# Patient Record
Sex: Female | Born: 1955 | Race: White | Hispanic: No | Marital: Married | Smoking: Never smoker
Health system: Southern US, Community
[De-identification: ages and names within clinical notes are randomized; demographics above are authoritative.]

## PROBLEM LIST (undated history)

## (undated) DIAGNOSIS — H6690 Otitis media, unspecified, unspecified ear: Secondary | ICD-10-CM

## (undated) DIAGNOSIS — I10 Essential (primary) hypertension: Secondary | ICD-10-CM

## (undated) DIAGNOSIS — N200 Calculus of kidney: Secondary | ICD-10-CM

## (undated) DIAGNOSIS — F419 Anxiety disorder, unspecified: Secondary | ICD-10-CM

## (undated) DIAGNOSIS — E669 Obesity, unspecified: Secondary | ICD-10-CM

## (undated) DIAGNOSIS — Z803 Family history of malignant neoplasm of breast: Secondary | ICD-10-CM

## (undated) DIAGNOSIS — M1990 Unspecified osteoarthritis, unspecified site: Secondary | ICD-10-CM

## (undated) DIAGNOSIS — E785 Hyperlipidemia, unspecified: Secondary | ICD-10-CM

## (undated) DIAGNOSIS — L719 Rosacea, unspecified: Secondary | ICD-10-CM

## (undated) DIAGNOSIS — G4733 Obstructive sleep apnea (adult) (pediatric): Secondary | ICD-10-CM

## (undated) DIAGNOSIS — J209 Acute bronchitis, unspecified: Secondary | ICD-10-CM

## (undated) HISTORY — PX: APPENDECTOMY: SHX54

## (undated) HISTORY — DX: Otitis media, unspecified, unspecified ear: H66.90

## (undated) HISTORY — DX: Obstructive sleep apnea (adult) (pediatric): G47.33

## (undated) HISTORY — DX: Acute bronchitis, unspecified: J20.9

## (undated) HISTORY — DX: Obesity, unspecified: E66.9

## (undated) HISTORY — DX: Hyperlipidemia, unspecified: E78.5

## (undated) HISTORY — PX: ABDOMINAL HYSTERECTOMY: SHX81

## (undated) HISTORY — DX: Essential (primary) hypertension: I10

## (undated) HISTORY — DX: Family history of malignant neoplasm of breast: Z80.3

## (undated) HISTORY — PX: TUBAL LIGATION: SHX77

## (undated) HISTORY — DX: Calculus of kidney: N20.0

## (undated) HISTORY — DX: Rosacea, unspecified: L71.9

## (undated) HISTORY — DX: Anxiety disorder, unspecified: F41.9

---

## 1984-08-15 HISTORY — PX: VESICOVAGINAL FISTULA CLOSURE W/ TAH: SUR271

## 2001-12-16 ENCOUNTER — Emergency Department (HOSPITAL_COMMUNITY): Admission: EM | Admit: 2001-12-16 | Discharge: 2001-12-16 | Payer: Self-pay | Admitting: Emergency Medicine

## 2001-12-16 ENCOUNTER — Encounter: Payer: Self-pay | Admitting: Emergency Medicine

## 2003-08-16 HISTORY — PX: CHOLECYSTECTOMY: SHX55

## 2005-07-17 ENCOUNTER — Emergency Department (HOSPITAL_COMMUNITY): Admission: EM | Admit: 2005-07-17 | Discharge: 2005-07-17 | Payer: Self-pay | Admitting: Emergency Medicine

## 2011-08-17 ENCOUNTER — Encounter: Payer: Self-pay | Admitting: Internal Medicine

## 2011-08-18 ENCOUNTER — Encounter: Payer: Self-pay | Admitting: Internal Medicine

## 2011-08-18 ENCOUNTER — Ambulatory Visit (INDEPENDENT_AMBULATORY_CARE_PROVIDER_SITE_OTHER): Payer: Managed Care, Other (non HMO) | Admitting: Internal Medicine

## 2011-08-18 ENCOUNTER — Ambulatory Visit (INDEPENDENT_AMBULATORY_CARE_PROVIDER_SITE_OTHER)
Admission: RE | Admit: 2011-08-18 | Discharge: 2011-08-18 | Disposition: A | Discharge status: Home / Self Care | Payer: Managed Care, Other (non HMO) | Source: Ambulatory Visit | Attending: Internal Medicine | Admitting: Internal Medicine

## 2011-08-18 VITALS — BP 128/80 | HR 133 | Temp 98.5°F | Ht 61.0 in | Wt 214.8 lb

## 2011-08-18 DIAGNOSIS — R059 Cough, unspecified: Secondary | ICD-10-CM | POA: Insufficient documentation

## 2011-08-18 DIAGNOSIS — R05 Cough: Secondary | ICD-10-CM

## 2011-08-18 MED ORDER — PREDNISONE (PAK) 10 MG PO TABS: ORAL_TABLET | ORAL | Status: AC

## 2011-08-18 MED ORDER — ESOMEPRAZOLE MAGNESIUM 40 MG PO CPDR: DELAYED_RELEASE_CAPSULE | ORAL | Status: DC

## 2011-08-18 MED ORDER — TRAMADOL HCL 50 MG PO TABS: ORAL_TABLET | ORAL | Status: AC

## 2011-08-18 NOTE — Assessment & Plan Note (Signed)
The most common causes of chronic cough in immunocompetent adults include the following: upper airway cough syndrome (UACS), previously referred to as postnasal drip syndrome (PNDS), which is caused by variety of rhinosinus conditions; (2) asthma; (3) GERD; (4) chronic bronchitis from cigarette smoking or other inhaled environmental irritants; (5) nonasthmatic eosinophilic bronchitis; and (6) bronchiectasis.   These conditions, singly or in combination, have accounted for up to 94% of the causes of chronic cough in prospective studies.   Other conditions have constituted no >6% of the causes in prospective studies These have included bronchogenic carcinoma, chronic interstitial pneumonia, sarcoidosis, left ventricular failure, ACEI-induced cough, and aspiration from a condition associated with pharyngeal dysfunction.   Of the three most common causes of chronic cough, only one (GERD)  can actually cause the other two (asthma and post nasal drip syndrome)  and perpetuate the cylce of cough inducing airway trauma, inflammation, heightened sensitivity to reflux which is prompted by the cough itself via a cyclical mechanism which she seems to get stuck in.   This may partially respond to steroids and look like asthma and post nasal drainage but never erradicated completely unless the cough and the secondary reflux are eliminated, preferably both at the same time.  While not intuitively obvious, many patients with chronic low grade reflux do not cough until there is a secondary insult that disturbs the protective epithelial barrier and exposes sensitive nerve endings.  This can be viral or direct physical injury such as with an endotracheal tube.   The point is that once this occurs, it is difficult to eliminate using anything but a maximally effective acid suppression regimen at least in the short run, accompanied by an appropriate diet to address non acid GERD.   See instructions for specific  recommendations which were reviewed directly with the patient who was given a copy with highlighter outlining the key components.

## 2011-08-18 NOTE — Patient Instructions (Addendum)
The key to effective treatment for your cough is eliminating the non-stop cycle of cough you're stuck in long enough to let your airway heal completely and then see if there is anything still making you cough once you stop the cough suppression, but this should take no more than 5 days to figuure out.  Stop pulmicort and all inhalants and lozenges  First take delsym two tsp every 12 hours and supplement if needed with  tramadol 50 mg up to 2 every 4 hours to suppress the urge to cough. Swallowing water or using ice chips/non mint and menthol containing candies (such as lifesavers or sugarless jolly ranchers) are also effective.  You should rest your voice and avoid activities that you know make you cough.  Once you have eliminated the cough for 3 straight days try reducing the tramadol first,  then the delsym as tolerated.    Try Nexium 40 mg    Take 30-60 min before first meal of the day and Pepcid 20 mg one bedtime until cough is completely gone for at least a week without the need for cough suppression  I think of reflux for chronic cough like I do oxygen for fire (doesn't cause the fire but once you get the oxygen suppressed it usually goes away regardless of the exact cause).  GERD (REFLUX)  is an extremely common cause of respiratory symptoms, many times with no significant heartburn at all.    It can be treated with medication, but also with lifestyle changes including avoidance of late meals, excessive alcohol, smoking cessation, and avoid fatty foods, chocolate, peppermint, colas, red wine, and acidic juices such as orange juice.  NO MINT OR MENTHOL PRODUCTS SO NO COUGH DROPS  USE SUGARLESS CANDY INSTEAD (jolley ranchers or Stover's)  NO OIL BASED VITAMINS - use powdered substitutes.    Prednisone 10 mg take  4 each am x 2 days,   2 each am x 2 days,  1 each am x2days and stop    Please remember to go to the  x-ray department downstairs for your tests - we will call you with the  results when they are available.      If you are satisfied with your treatment plan let your doctor know and he/she can either refill your medications or you can return here when your prescription runs out.     If in any way you are not 100% satisfied,  please return with all medications .  If 100% better, tell your friends!

## 2011-08-18 NOTE — Progress Notes (Signed)
  Subjective:    Patient ID: Madison Wheeler, female    DOB: Jul 19, 1956, 56 y.o.   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   08/18/2011 1st pulmonary eval already by Kozlow "no allergies" dx ? Pertussis  sev years ago completely resolved after a month tends to come back once a year not necessarily in winter and  eval by Chodri dx by sleep apnea  started on cpap x one year and this present exac abruptly   started 2 months ago with cough mostly dry assoc fever  Which resolved p zpak but the cough never improved - severe enough to where  gags and  vomits / urinary incont / worse with deodorizer/ change in temps/ very hoarse / does sleep after getting codeine s premature awakening.   Already on nexium but not taking ac consistently. No overt HB or sinus complaints   Also denies any obvious fluctuation of symptoms with weather or environmental changes or other aggravating or alleviating factors except as outlined above    Review of Systems  Constitutional: Negative for fever, chills and unexpected weight change.  HENT: Negative for ear pain, nosebleeds, congestion, sore throat, rhinorrhea, sneezing, trouble swallowing, dental problem, voice change, postnasal drip and sinus pressure.   Eyes: Negative for visual disturbance.  Respiratory: Positive for cough. Negative for choking and shortness of breath.   Cardiovascular: Negative for chest pain and leg swelling.  Gastrointestinal: Negative for vomiting, abdominal pain and diarrhea.  Genitourinary: Negative for difficulty urinating.  Musculoskeletal: Negative for arthralgias.  Skin: Negative for rash.  Neurological: Positive for headaches. Negative for tremors and syncope.  Hematological: Does not bruise/bleed easily.       Objective:   Physical Exam  Obese wf nad with freq throat clearing and harsh barking quality cough  Wt 214 08/18/2011   HEENT: nl  dentition, turbinates, and orophanx. Nl external ear canals without cough reflex   NECK :  without JVD/Nodes/TM/ nl carotid upstrokes bilaterally   LUNGS: no acc muscle use, clear to A and P bilaterally without cough on insp or exp maneuvers   CV:  RRR  no s3 or murmur or increase in P2, no edema   ABD:  soft and nontender with nl excursion in the supine position. No bruits or organomegaly, bowel sounds nl  MS:  warm without deformities, calf tenderness, cyanosis or clubbing  SKIN: warm and dry without lesions    NEURO:  alert, approp, no deficits    CXR  08/18/2011 : Low lung volumes. Suspect mild bibasilar scarring.           Assessment & Plan:

## 2011-08-19 ENCOUNTER — Telehealth: Payer: Self-pay | Admitting: Internal Medicine

## 2011-08-19 NOTE — Telephone Encounter (Signed)
I spoke with patient about results and she verbalized understanding and had no questions 

## 2015-06-12 ENCOUNTER — Other Ambulatory Visit: Payer: Self-pay | Admitting: Obstetrics and Gynecology

## 2015-06-12 DIAGNOSIS — N6320 Unspecified lump in the left breast, unspecified quadrant: Secondary | ICD-10-CM

## 2015-06-17 ENCOUNTER — Ambulatory Visit
Admission: RE | Admit: 2015-06-17 | Discharge: 2015-06-17 | Disposition: A | Discharge status: Home / Self Care | Payer: Commercial Indemnity | Source: Ambulatory Visit | Attending: Obstetrics and Gynecology | Admitting: Obstetrics and Gynecology

## 2015-06-17 ENCOUNTER — Other Ambulatory Visit: Payer: Self-pay | Admitting: Obstetrics and Gynecology

## 2015-06-17 DIAGNOSIS — N6320 Unspecified lump in the left breast, unspecified quadrant: Secondary | ICD-10-CM

## 2015-06-18 ENCOUNTER — Other Ambulatory Visit: Payer: Self-pay | Admitting: Obstetrics and Gynecology

## 2015-06-18 DIAGNOSIS — N6320 Unspecified lump in the left breast, unspecified quadrant: Secondary | ICD-10-CM

## 2015-06-19 ENCOUNTER — Ambulatory Visit
Admission: RE | Admit: 2015-06-19 | Discharge: 2015-06-19 | Disposition: A | Discharge status: Home / Self Care | Payer: Commercial Indemnity | Source: Ambulatory Visit | Attending: Obstetrics and Gynecology | Admitting: Obstetrics and Gynecology

## 2015-06-19 DIAGNOSIS — N6320 Unspecified lump in the left breast, unspecified quadrant: Secondary | ICD-10-CM

## 2020-09-15 DIAGNOSIS — C801 Malignant (primary) neoplasm, unspecified: Secondary | ICD-10-CM

## 2020-09-15 HISTORY — DX: Malignant (primary) neoplasm, unspecified: C80.1

## 2020-09-28 ENCOUNTER — Other Ambulatory Visit: Payer: Self-pay | Admitting: Specialist

## 2020-09-28 DIAGNOSIS — R928 Other abnormal and inconclusive findings on diagnostic imaging of breast: Secondary | ICD-10-CM

## 2020-09-29 ENCOUNTER — Other Ambulatory Visit: Payer: Self-pay | Admitting: Specialist

## 2020-09-29 DIAGNOSIS — R928 Other abnormal and inconclusive findings on diagnostic imaging of breast: Secondary | ICD-10-CM

## 2020-10-09 ENCOUNTER — Ambulatory Visit
Admission: RE | Admit: 2020-10-09 | Discharge: 2020-10-09 | Disposition: A | Discharge status: Home / Self Care | Payer: Medicare Other | Source: Ambulatory Visit | Attending: Specialist | Admitting: Specialist

## 2020-10-09 ENCOUNTER — Other Ambulatory Visit: Payer: Self-pay

## 2020-10-09 DIAGNOSIS — R928 Other abnormal and inconclusive findings on diagnostic imaging of breast: Secondary | ICD-10-CM

## 2020-10-13 ENCOUNTER — Telehealth: Payer: Self-pay | Admitting: Hematology and Oncology

## 2020-10-13 ENCOUNTER — Encounter: Payer: Self-pay | Admitting: *Deleted

## 2020-10-13 DIAGNOSIS — D0511 Intraductal carcinoma in situ of right breast: Secondary | ICD-10-CM | POA: Insufficient documentation

## 2020-10-13 NOTE — Telephone Encounter (Signed)
Spoke to patient to confirm afternoon appointment packet mailed, patient was also informed she could bring her husband and her sister to this appointment

## 2020-10-20 NOTE — Progress Notes (Signed)
[facility] CONSULT NOTE  Patient Care Team: Raina Mina., MD as PCP - General (Internal Medicine) Mauro Kaufmann, RN as Oncology Nurse Navigator Rockwell Germany, RN as Oncology Nurse Navigator  CHIEF COMPLAINTS/PURPOSE OF CONSULTATION:  Newly diagnosed breast cancer  HISTORY OF PRESENTING ILLNESS:  Madison Wheeler 65 y.o. female is here because of recent diagnosis of DCIS of the right breast. Screening mammogram on 09/07/20 showed right breast calcifications. Diagnostic mammogram on 09/24/20 showed right breast calcifications spanning 0.9cm and enlarged left axillary lymph nodes. Biopsy on 10/09/20 showed high grade ductal carcinoma in situ, ER+ 95%, PR+ 90%. She presents to the clinic today for initial evaluation and discussion of treatment options.   I reviewed her records extensively and collaborated the history with the patient.  SUMMARY OF ONCOLOGIC HISTORY: Oncology History  Ductal carcinoma in situ (DCIS) of right breast  10/13/2020 Initial Diagnosis   Screening mammogram showed right breast calcifications. Diagnostic mammogram showed right breast calcifications spanning 0.9cm and enlarged left axillary lymph nodes. 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   10/21/2020 Cancer Staging   Staging form: Breast, AJCC 8th Edition - Clinical stage from 10/21/2020: Stage 0 (cTis (DCIS), cN0, cM0, G3, ER+, PR+, HER2: Not Assessed) - Signed by Nicholas Lose, MD on 10/21/2020 Stage prefix: Initial diagnosis Laterality: Right Staged by: Pathologist and managing physician Stage used in treatment planning: Yes National guidelines used in treatment planning: Yes Type of national guideline used in treatment planning: NCCN     MEDICAL HISTORY:  Past Medical History:  Diagnosis Date  . Acute bronchitis   . Anxiety   . Calculus of kidney   . Family history of malignant neoplasm of breast   . Hyperlipidemia   . Hypertension   . Obesity   . OSA  (obstructive sleep apnea)   . Rosacea   . Unspecified otitis media   . Vitamin D deficiency     SURGICAL HISTORY:  The histories are not reviewed yet. Please review them in the "History" navigator section and refresh this [place].  SOCIAL HISTORY: Social History   Socioeconomic History  . Marital status: Married    Spouse name: Not on file  . Number of children: 2  . Years of education: Not on file  . Highest education level: Not on file  Occupational History  . Occupation: Therapist, sports  Tobacco Use  . Smoking status: Never Smoker  . Smokeless tobacco: Never Used  Substance and Sexual Activity  . Alcohol use: No  . Drug use: No  . Sexual activity: Not on file  Other Topics Concern  . Not on file  Social History Narrative  . Not on file   Social Determinants of Health   Financial Resource Strain: Not on file  Food Insecurity: Not on file  Transportation Needs: Not on file  Physical Activity: Not on file  Stress: Not on file  Social Connections: Not on file  Intimate Partner Violence: Not on file    FAMILY HISTORY: Family History  Problem Relation Age of Onset  . Breast cancer Maternal Grandmother   . Breast cancer Sister 54  . Breast cancer Maternal Aunt 47  . Breast cancer Paternal Grandmother 46  . Breast cancer Paternal Aunt 66  . Colon cancer Paternal Uncle   . Breast cancer Sister 69       twin sister  . Lung cancer Paternal Uncle   . Breast cancer Cousin 55  . Breast cancer  Cousin 27  . Breast cancer Cousin 11  . Breast cancer Maternal Aunt 60  . Breast cancer Maternal Aunt 58    ALLERGIES:  has No Known Allergies.  MEDICATIONS:  Current Outpatient Medications  Medication Sig Dispense Refill  . Ascorbic Acid (VITAMIN C PO) Take 1,000 mg by mouth daily.    Marland Kitchen atorvastatin (LIPITOR) 10 MG tablet Take 10 mg by mouth daily.    . cloNIDine (CATAPRES) 0.2 MG tablet Take 0.2 mg by mouth 2 (two) times daily.    Marland Kitchen esomeprazole (NEXIUM) 40 MG capsule Take 30-60  min before first meal of the day 30 capsule 1   No current facility-administered medications for this visit.    REVIEW OF SYSTEMS:   Constitutional: Denies fevers, chills or abnormal night sweats   All other systems were reviewed with the patient and are negative.  PHYSICAL EXAMINATION: ECOG PERFORMANCE STATUS: 1 - Symptomatic but completely ambulatory  Vitals:   10/21/20 1251  BP: 132/88  Pulse: 85  Resp: 18  Temp: 97.6 F (36.4 C)  SpO2: 98%   Filed Weights   10/21/20 1251  Weight: 221 lb 8 oz (100.5 kg)       LABORATORY DATA:  I have reviewed the data as listed Lab Results  Component Value Date   WBC 8.0 10/21/2020   HGB 13.8 10/21/2020   HCT 40.8 10/21/2020   MCV 80.6 10/21/2020   PLT 315 10/21/2020   Lab Results  Component Value Date   NA 140 10/21/2020   K 4.0 10/21/2020   CL 107 10/21/2020   CO2 23 10/21/2020    RADIOGRAPHIC STUDIES: I have personally reviewed the radiological reports and agreed with the findings in the report.  ASSESSMENT AND PLAN:  Ductal carcinoma in situ (DCIS) of right breast 10/13/2020:Screening mammogram showed right breast calcifications. Diagnostic mammogram showed right breast calcifications spanning 0.9cm and enlarged left axillary lymph nodes. 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  Pathology review: I discussed with the patient the difference between DCIS and invasive breast cancer. It is considered a precancerous lesion. DCIS is classified as a 0. It is generally detected through mammograms as calcifications. We discussed the significance of grades and its impact on prognosis. We also discussed the importance of ER and PR receptors and their implications to adjuvant treatment options. Prognosis of DCIS dependence on grade, comedo necrosis. It is anticipated that if not treated, 20-30% of DCIS can develop into invasive breast cancer.  Recommendation: 1. Breast conserving surgery 2. Followed by  adjuvant radiation therapy 3. Followed by antiestrogen therapy with tamoxifen 5 years 4.  Genetics (sister age 78, 2 grandmothers and 2 aunts had breast cancer)  Tamoxifen counseling: We discussed the risks and benefits of tamoxifen. These include but not limited to insomnia, hot flashes, mood changes, vaginal dryness, and weight gain. Although rare, serious side effects including endometrial cancer, risk of blood clots were also discussed. We strongly believe that the benefits far outweigh the risks. Patient understands these risks and consented to starting treatment. Planned treatment duration is 5 years.  Return to clinic after surgery to discuss the final pathology report and come up with an adjuvant treatment plan.     All questions were answered. The patient knows to call the clinic with any problems, questions or concerns.   Rulon Eisenmenger, MD, MPH 10/21/2020    I, Molly Dorshimer, am acting as scribe for Nicholas Lose, MD.  I have reviewed the above  documentation for accuracy and completeness, and I agree with the above.

## 2020-10-21 ENCOUNTER — Encounter: Payer: Self-pay | Admitting: Radiation Oncology

## 2020-10-21 ENCOUNTER — Ambulatory Visit: Payer: Medicare Other | Admitting: Physical Therapy

## 2020-10-21 ENCOUNTER — Ambulatory Visit
Admission: RE | Admit: 2020-10-21 | Discharge: 2020-10-21 | Disposition: A | Discharge status: Home / Self Care | Payer: Medicare Other | Source: Ambulatory Visit | Attending: Radiation Oncology | Admitting: Radiation Oncology

## 2020-10-21 ENCOUNTER — Encounter: Payer: Self-pay | Admitting: *Deleted

## 2020-10-21 ENCOUNTER — Other Ambulatory Visit: Payer: Self-pay

## 2020-10-21 ENCOUNTER — Ambulatory Visit: Payer: Self-pay | Admitting: Surgery

## 2020-10-21 ENCOUNTER — Ambulatory Visit (HOSPITAL_BASED_OUTPATIENT_CLINIC_OR_DEPARTMENT_OTHER): Payer: Medicare Other | Admitting: Genetic Counselor

## 2020-10-21 ENCOUNTER — Inpatient Hospital Stay: Payer: Medicare Other

## 2020-10-21 ENCOUNTER — Inpatient Hospital Stay: Payer: Medicare Other | Attending: Hematology and Oncology | Admitting: Hematology and Oncology

## 2020-10-21 ENCOUNTER — Encounter: Payer: Self-pay | Admitting: Genetic Counselor

## 2020-10-21 ENCOUNTER — Encounter: Payer: Self-pay | Admitting: Physical Therapy

## 2020-10-21 DIAGNOSIS — C50511 Malignant neoplasm of lower-outer quadrant of right female breast: Secondary | ICD-10-CM

## 2020-10-21 DIAGNOSIS — Z803 Family history of malignant neoplasm of breast: Secondary | ICD-10-CM

## 2020-10-21 DIAGNOSIS — Z801 Family history of malignant neoplasm of trachea, bronchus and lung: Secondary | ICD-10-CM | POA: Diagnosis not present

## 2020-10-21 DIAGNOSIS — Z170 Estrogen receptor positive status [ER+]: Secondary | ICD-10-CM | POA: Insufficient documentation

## 2020-10-21 DIAGNOSIS — Z808 Family history of malignant neoplasm of other organs or systems: Secondary | ICD-10-CM

## 2020-10-21 DIAGNOSIS — Z800 Family history of malignant neoplasm of digestive organs: Secondary | ICD-10-CM | POA: Insufficient documentation

## 2020-10-21 DIAGNOSIS — R293 Abnormal posture: Secondary | ICD-10-CM | POA: Insufficient documentation

## 2020-10-21 DIAGNOSIS — Z9013 Acquired absence of bilateral breasts and nipples: Secondary | ICD-10-CM | POA: Diagnosis not present

## 2020-10-21 DIAGNOSIS — Z79899 Other long term (current) drug therapy: Secondary | ICD-10-CM | POA: Diagnosis not present

## 2020-10-21 DIAGNOSIS — D0511 Intraductal carcinoma in situ of right breast: Secondary | ICD-10-CM

## 2020-10-21 DIAGNOSIS — R590 Localized enlarged lymph nodes: Secondary | ICD-10-CM | POA: Diagnosis not present

## 2020-10-21 LAB — CMP (CANCER CENTER ONLY)
ALT: 25 U/L (ref 0–44)
AST: 19 U/L (ref 15–41)
Albumin: 4.1 g/dL (ref 3.5–5.0)
Alkaline Phosphatase: 137 U/L — ABNORMAL HIGH (ref 38–126)
Anion gap: 10 (ref 5–15)
BUN: 11 mg/dL (ref 8–23)
CO2: 23 mmol/L (ref 22–32)
Calcium: 9.3 mg/dL (ref 8.9–10.3)
Chloride: 107 mmol/L (ref 98–111)
Creatinine: 0.72 mg/dL (ref 0.44–1.00)
GFR, Estimated: >60 mL/min (ref >60)
Glucose, Bld: 101 mg/dL — ABNORMAL HIGH (ref 70–99)
Potassium: 4 mmol/L (ref 3.5–5.1)
Sodium: 140 mmol/L (ref 135–145)
Total Bilirubin: 0.5 mg/dL (ref 0.3–1.2)
Total Protein: 7.4 g/dL (ref 6.5–8.1)

## 2020-10-21 LAB — CBC WITH DIFFERENTIAL (CANCER CENTER ONLY)
Abs Immature Granulocytes: 0.03 10*3/uL (ref 0.00–0.07)
Basophils Absolute: 0 10*3/uL (ref 0.0–0.1)
Basophils Relative: 0 %
Eosinophils Absolute: 0.1 10*3/uL (ref 0.0–0.5)
Eosinophils Relative: 2 %
HCT: 40.8 % (ref 36.0–46.0)
Hemoglobin: 13.8 g/dL (ref 12.0–15.0)
Immature Granulocytes: 0 %
Lymphocytes Relative: 22 %
Lymphs Abs: 1.8 10*3/uL (ref 0.7–4.0)
MCH: 27.3 pg (ref 26.0–34.0)
MCHC: 33.8 g/dL (ref 30.0–36.0)
MCV: 80.6 fL (ref 80.0–100.0)
Monocytes Absolute: 0.5 10*3/uL (ref 0.1–1.0)
Monocytes Relative: 6 %
Neutro Abs: 5.6 10*3/uL (ref 1.7–7.7)
Neutrophils Relative %: 70 %
Platelet Count: 315 10*3/uL (ref 150–400)
RBC: 5.06 MIL/uL (ref 3.87–5.11)
RDW: 12.6 % (ref 11.5–15.5)
WBC Count: 8 10*3/uL (ref 4.0–10.5)
nRBC: 0 % (ref 0.0–0.2)

## 2020-10-21 LAB — GENETIC SCREENING ORDER

## 2020-10-21 NOTE — H&P (View-Only) (Signed)
ean M Aoun Appointment: 10/21/2020 1:00 PM Location: [facility] Patient #: [id number] DOB: 11/23/1955 Undefined / Language: English / Race: White Female  History of Present Illness (Samuella Rasool A. Jaylun Fleener MD; 10/21/2020 2:50 PM) Patient words: Pt presents to the [facility] for abnormal mammogram showing right breast microcalcifications - core bx high grade DCIS  History of multiple family members with breast cancer- genetics 2011 negative for BRCA  No other complaints of pain discharge or mass.  The patient is a 64 year old female.   Past Surgical History (Wendy Smith, RN; 10/21/2020 8:26 AM) Cesarean Section - Multiple Gallbladder Surgery - Laparoscopic Hysterectomy (not due to cancer) - Partial  Diagnostic Studies History (Wendy Smith, RN; 10/21/2020 8:26 AM) Colonoscopy 1-5 years ago Mammogram 1-3 years ago  Medication History (Wendy Smith, RN; 10/21/2020 8:26 AM) Medications Reconciled  Social History (Wendy Smith, RN; 10/21/2020 8:26 AM) Caffeine use Carbonated beverages. No alcohol use No drug use Tobacco use Never smoker.  Family History (Wendy Smith, RN; 10/21/2020 8:26 AM) Arthritis Father, Mother. Breast Cancer Family Members In General, Sister. Heart Disease Family Members In General. Hypertension Father, Mother. Thyroid problems Mother.  Pregnancy / Birth History (Wendy Smith, RN; 10/21/2020 8:26 AM) Age at menarche 16 years. Age of menopause <44 Gravida 2 Irregular periods Length (months) of breastfeeding 3-6 Maternal age 26-30 Para 2  Other Problems (Wendy Smith, RN; 10/21/2020 8:26 AM) Arthritis Cholelithiasis High blood pressure Kidney Stone Sleep Apnea Transfusion history     Review of Systems (Wendy Smith RN; 10/21/2020 8:26 AM) General Not Present- Appetite Loss, Chills, Fatigue, Fever, Night Sweats, Weight Gain and Weight Loss. Skin Not Present- Change in Wart/Mole, Dryness, Hives, Jaundice, New Lesions, Non-Healing Wounds,  Rash and Ulcer. HEENT Not Present- Earache, Hearing Loss, Hoarseness, Nose Bleed, Oral Ulcers, Ringing in the Ears, Seasonal Allergies, Sinus Pain, Sore Throat, Visual Disturbances, Wears glasses/contact lenses and Yellow Eyes. Respiratory Not Present- Bloody sputum, Chronic Cough, Difficulty Breathing, Snoring and Wheezing. Breast Not Present- Breast Mass, Breast Pain, Nipple Discharge and Skin Changes. Cardiovascular Not Present- Chest Pain, Difficulty Breathing Lying Down, Leg Cramps, Palpitations, Rapid Heart Rate, Shortness of Breath and Swelling of Extremities. Gastrointestinal Not Present- Abdominal Pain, Bloating, Bloody Stool, Change in Bowel Habits, Chronic diarrhea, Constipation, Difficulty Swallowing, Excessive gas, Gets full quickly at meals, Hemorrhoids, Indigestion, Nausea, Rectal Pain and Vomiting. Female Genitourinary Present- Nocturia. Not Present- Frequency, Painful Urination, Pelvic Pain and Urgency. Musculoskeletal Not Present- Back Pain, Joint Pain, Joint Stiffness, Muscle Pain, Muscle Weakness and Swelling of Extremities. Neurological Not Present- Decreased Memory, Fainting, Headaches, Numbness, Seizures, Tingling, Tremor, Trouble walking and Weakness. Psychiatric Not Present- Anxiety, Bipolar, Change in Sleep Pattern, Depression, Fearful and Frequent crying. Endocrine Not Present- Cold Intolerance, Excessive Hunger, Hair Changes, Heat Intolerance, Hot flashes and New Diabetes. Hematology Not Present- Blood Thinners, Easy Bruising, Excessive bleeding, Gland problems, HIV and Persistent Infections.   Physical Exam (Olivier Frayre A. Azucena Dart MD; 10/21/2020 2:51 PM)  General Mental Status-Alert. General Appearance-Consistent with stated age. Hydration-Well hydrated. Voice-Normal.  Breast Note: no masses bilaterally scar on left noted no nipple discharge  Cardiovascular Cardiovascular examination reveals -normal heart sounds, regular rate and rhythm with no murmurs and  normal pedal pulses bilaterally.  Neurologic Neurologic evaluation reveals -alert and oriented x 3 with no impairment of recent or remote memory. Mental Status-Normal.  Lymphatic Head & Neck  General Head & Neck Lymphatics: Bilateral - Description - Normal. Axillary  General Axillary Region: Bilateral - Description - Normal. Tenderness - Non Tender.      Assessment & Plan (Luciano Cinquemani A. Emilianna Barlowe MD; 10/21/2020 2:53 PM)  BREAST NEOPLASM, TIS (DCIS), RIGHT (D05.11) Impression: PT has opted for bilateral simple mastectomies and right SLN mapping given family history pro and cons of this approached discussed  Risk of sentinel lymph node mapping include bleeding, infection, lymphedema, shoulder pain. stiffness, dye allergy. cosmetic deformity , blood clots, death, need for more surgery. Pt agres to proceed. Discussed treatment options for breast cancer to include breast conservation vs mastectomy with reconstruction. Pt has decided on mastectomy. Risk include bleeding, infection, flap necrosis, pain, numbness, recurrence, hematoma, other surgery needs. Pt understands and agrees to proceed.  Current Plans You are being scheduled for surgery- Our schedulers will call you.  You should hear from our office's scheduling department within 5 working days about the location, date, and time of surgery. We try to make accommodations for patient's preferences in scheduling surgery, but sometimes the OR schedule or the surgeon's schedule prevents us from making those accommodations.  If you have not heard from our office ([phone]) in 5 working days, call the office and ask for your surgeon's nurse.  If you have other questions about your diagnosis, plan, or surgery, call the office and ask for your surgeon's nurse.  Pt Education - [facility] Breast Cancer Information Given - [facility] "Breast Journey" Package Pt Education - [facility] breast cancer surgery: discussed with patient and provided information. Pt  Education - [facility] Mastectomy HCI  FAMILY HISTORY OF BREAST CANCER (Z80.3) 

## 2020-10-21 NOTE — Therapy (Signed)
[place], [place], [address] Phone: [phone]   Fax:  [phone]  Physical Therapy Evaluation  Patient Details  Name: Madison Wheeler MRN: [id number] Date of Birth: 1956/07/23 Referring Provider (PT): Dr. Erroll Luna   Encounter Date: 10/21/2020   PT End of Session - 10/21/20 1927    Visit Number 1    Number of Visits 2    Date for PT Re-Evaluation 12/16/20    PT Start Time 1351    PT Stop Time 1424    PT Time Calculation (min) 33 min    Activity Tolerance Patient tolerated treatment well    Behavior During Therapy [facility] for tasks assessed/performed           Past Medical History:  Diagnosis Date  . Acute bronchitis   . Anxiety   . Calculus of kidney   . Family history of malignant neoplasm of breast   . Hyperlipidemia   . Hypertension   . Obesity   . OSA (obstructive sleep apnea)   . Rosacea   . Unspecified otitis media   . Vitamin D deficiency     Past Surgical History:  Procedure Laterality Date  . ABDOMINAL HYSTERECTOMY    . APPENDECTOMY    . CESAREAN SECTION    . CHOLECYSTECTOMY  2005  . TUBAL LIGATION    . VESICOVAGINAL FISTULA CLOSURE W/ TAH  1986    There were no vitals filed for this visit.    Subjective Assessment - 10/21/20 1918    Subjective Patient reports she is here todya to be seen by her medical team for her newly diagnosed right breast cancer.    Patient is accompained by: Family member    Pertinent History Patient was diagnosed on 09/04/2020 with right high grade DCIS breast cancer. It measures 9 mm and is located in the lower outer quadrant. It is ER/PR positive.    Patient Stated Goals Reduce lymphedema risk and learn post op shoulder ROM HEP    Currently in Pain? Yes    Pain Score --   Varies   Pain Location Finger (Comment which one)   Thumbs   Pain Orientation Right;Left    Pain Descriptors / Indicators Aching    Pain Type Chronic pain    Pain Onset  More than a month ago    Pain Frequency Intermittent    Aggravating Factors  Typing, using hands    Pain Relieving Factors Rest              [facility] PT Assessment - 10/21/20 0001      Assessment   Medical Diagnosis Right breast cancer    Referring Provider (PT) Dr. Marcello Moores Cornett    Onset Date/Surgical Date 09/04/20    Hand Dominance Right    Prior Therapy none      Precautions   Precautions Other (comment)    Precaution Comments active cancer      Restrictions   Weight Bearing Restrictions No      Balance Screen   Has the patient fallen in the past 6 months No    Has the patient had a decrease in activity level because of a fear of falling?  No    Is the patient reluctant to leave their home because of a fear of falling?  No      Home Environment   Living Environment Private residence    Living Arrangements Spouse/significant other   Her husband requires total care by  her   Available Help at Discharge Family      Prior Function   Level of Independence Independent    Vocation Retired    Biomedical scientist Retired Marine scientist    Leisure She does not exercise      Cognition   Overall Cognitive Status Within Functional Limits for tasks assessed      Posture/Postural Control   Posture/Postural Control Postural limitations    Postural Limitations Rounded Shoulders;Forward head      ROM / Strength   AROM / PROM / Strength AROM;Strength      AROM   Overall AROM Comments Cervical AROM is WNL    AROM Assessment Site Shoulder    Right/Left Shoulder Right;Left    Right Shoulder Extension 47 Degrees    Right Shoulder Flexion 152 Degrees    Right Shoulder ABduction 160 Degrees    Right Shoulder Internal Rotation 79 Degrees    Right Shoulder External Rotation 81 Degrees    Left Shoulder Extension 58 Degrees    Left Shoulder Flexion 147 Degrees    Left Shoulder ABduction 151 Degrees    Left Shoulder Internal Rotation 68 Degrees    Left Shoulder External Rotation 78  Degrees      Strength   Overall Strength Within functional limits for tasks performed             LYMPHEDEMA/ONCOLOGY QUESTIONNAIRE - 10/21/20 0001      Type   Cancer Type Right breast cancer      Lymphedema Assessments   Lymphedema Assessments Upper extremities      Right Upper Extremity Lymphedema   10 cm Proximal to Olecranon Process 38.9 cm    Olecranon Process 27.5 cm    10 cm Proximal to Ulnar Styloid Process 23.5 cm    Just Proximal to Ulnar Styloid Process 15.8 cm    Across Hand at [employer] 18.9 cm    At [place] of 2nd Digit 6.3 cm      Left Upper Extremity Lymphedema   10 cm Proximal to Olecranon Process 40.6 cm    Olecranon Process 27.9 cm    10 cm Proximal to Ulnar Styloid Process 22.3 cm    Just Proximal to Ulnar Styloid Process 15.8 cm    Across Hand at [employer] 18.1 cm    At [place] of 2nd Digit 6 cm           L-DEX FLOWSHEETS - 10/21/20 1900      L-DEX LYMPHEDEMA SCREENING   Measurement Type Unilateral    L-DEX MEASUREMENT EXTREMITY Upper Extremity    POSITION  Standing    DOMINANT SIDE Right    At Risk Side Right    BASELINE SCORE (UNILATERAL) -0.1          The patient was assessed using the L-Dex machine today to produce a lymphedema index baseline score. The patient will be reassessed on a regular basis (typically every 3 months) to obtain new L-Dex scores. If the score is > 6.5 points away from his/her baseline score indicating onset of subclinical lymphedema, it will be recommended to wear a compression garment for 4 weeks, 12 hours per day and then be reassessed. If the score continues to be > 6.5 points from baseline at reassessment, we will initiate lymphedema treatment. Assessing in this manner has a 95% rate of preventing clinically significant lymphedema.       Katina Dung - 10/21/20 0001    Open a tight or new jar  Severe difficulty    Do heavy household chores (wash walls, wash floors) No difficulty    Carry a shopping bag  or briefcase No difficulty    Wash your back No difficulty    Use a knife to cut food No difficulty    Recreational activities in which you take some force or impact through your arm, shoulder, or hand (golf, hammering, tennis) Mild difficulty    During the past week, to what extent has your arm, shoulder or hand problem interfered with your normal social activities with family, friends, neighbors, or groups? Not at all    During the past week, to what extent has your arm, shoulder or hand problem limited your work or other regular daily activities Not at all    Arm, shoulder, or hand pain. Moderate    Tingling (pins and needles) in your arm, shoulder, or hand None    Difficulty Sleeping No difficulty    DASH Score 13.64 %            Objective measurements completed on examination: See above findings.        Patient was instructed today in a home exercise program today for post op shoulder range of motion. These included active assist shoulder flexion in sitting, scapular retraction, wall walking with shoulder abduction, and hands behind head external rotation.  She was encouraged to do these twice a day, holding 3 seconds and repeating 5 times when permitted by her physician.           PT Education - 10/21/20 1925    Education Details Lymphedema risk reduction and post op shoulder ROM HEP    Person(s) Educated Patient;Spouse;Other (comment)   sister   Methods Explanation;Demonstration;Handout    Comprehension Verbalized understanding;Returned demonstration               PT Long Term Goals - 10/21/20 1917      PT LONG TERM GOAL #1   Title Patient will demonstrate she has regained full shoulder ROM and function post operatively compared to baselines.    Time 8    Period Weeks    Status New    Target Date 12/16/20           Breast Clinic Goals - 10/21/20 1946      Patient will be able to verbalize understanding of pertinent lymphedema risk reduction practices  relevant to her diagnosis specifically related to skin care.   Time 1    Period Days    Status Achieved      Patient will be able to return demonstrate and/or verbalize understanding of the post-op home exercise program related to regaining shoulder range of motion.   Time 1    Period Days    Status Achieved      Patient will be able to verbalize understanding of the importance of attending the postoperative After Breast Cancer Class for further lymphedema risk reduction education and therapeutic exercise.   Time 1    Period Days    Status Achieved                 Plan - 10/21/20 1927    Clinical Impression Statement Patient was diagnosed on 09/04/2020 with right high grade DCIS breast cancer. It measures 9 mm and is located in the lower outer quadrant. It is ER/PR positive. Her multidisciplinary medical team met prior to her assessments to determine a recommended treatment plan. She is planning to have a bilateral mastectomy with a right  sentinel node biopsy followed by anti-estrogen therapy. She will benefit from a post op PT reassessment to determine needs and from L-Dex screens every 3 months for 2 years to detect subclinical lymphedema.    Stability/Clinical Decision Making Stable/Uncomplicated    Clinical Decision Making Low    Rehab Potential Excellent    PT Frequency --   Eval and 1 f/u visit   PT Treatment/Interventions ADLs/Self Care Home Management;Therapeutic exercise;Patient/family education    PT Next Visit Plan Will reassess 3-4 weeks post op    PT Home Exercise Plan Post op shoulder ROM HEP    Consulted and Agree with Plan of Care Patient;Family member/caregiver    Family Member Consulted husband and sister           Patient will benefit from skilled therapeutic intervention in order to improve the following deficits and impairments:  Decreased knowledge of precautions,Postural dysfunction,Decreased range of motion,Impaired UE functional use,Pain  Visit  Diagnosis: Malignant neoplasm of lower-outer quadrant of right breast of female, estrogen receptor positive ([place]) - Plan: PT plan of care cert/re-cert  Abnormal posture - Plan: PT plan of care cert/re-cert   Patient will follow up at outpatient cancer rehab 3-4 weeks following surgery.  If the patient requires physical therapy at that time, a specific plan will be dictated and sent to the referring physician for approval. The patient was educated today on appropriate basic range of motion exercises to begin post operatively and the importance of attending the After Breast Cancer class following surgery.  Patient was educated today on lymphedema risk reduction practices as it pertains to recommendations that will benefit the patient immediately following surgery.  She verbalized good understanding.      Problem List Patient Active Problem List   Diagnosis Date Noted  . Ductal carcinoma in situ (DCIS) of right breast 10/13/2020  . Cough 08/18/2011   Annia Friendly, PT 10/21/20 7:48 PM  [place], [place], [address] Phone: [phone]   Fax:  [phone]  Name: Madison Wheeler MRN: [id number] Date of Birth: September 30, 1955

## 2020-10-21 NOTE — Progress Notes (Signed)
REFERRING PROVIDER: Nicholas Lose, MD [place],  [place] [id number]  PRIMARY PROVIDER:  Raina Mina., MD  PRIMARY REASON FOR VISIT:  Encounter Diagnoses  Name Primary?   Ductal carcinoma in situ (DCIS) of right breast Yes   Family history of breast cancer    Family history of skin cancer    Family history of lung cancer     HISTORY OF PRESENT ILLNESS:   Madison Wheeler, a 65 y.o. female, was seen for a [place] cancer genetics consultation during the breast multidisciplinary clinic at the request of Dr. Lindi Adie due to a personal and family history of cancer.  Madison Wheeler presents to clinic today to discuss the possibility of a hereditary predisposition to cancer, to discuss genetic testing, and to further clarify her future cancer risks, as well as potential cancer risks for family members.   In March 2022, at the age of 87, Madison Wheeler was diagnosed with ductal carcinoma in situ of the right breast (ER+/PR+).  The preliminary treatment plan includes breast conserving surgery, adjuvant radiation, and anti-estrogens.  Madison Wheeler had negative genetic testing in 2011 through [facility], which included BRCA1/2 only.    CANCER HISTORY:  Oncology History  Ductal carcinoma in situ (DCIS) of right breast  10/13/2020 Initial Diagnosis   Screening mammogram showed right breast calcifications. Diagnostic mammogram showed right breast calcifications spanning 0.9cm and enlarged left axillary lymph nodes. 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    RISK FACTORS:  Menarche was at age 2.  First live birth at age 57.  OCP use for approximately 0 years.  Ovaries intact: yes.  Hysterectomy: yes in 1986 Menopausal status: postmenopausal.  HRT use: 0 years. Colonoscopy: yes; most recent 2019. Mammogram within the last year: yes. Up to date with pelvic exams: yes; most recent PAP in 2019  Past Medical History:  Diagnosis Date   Acute bronchitis     Anxiety    Calculus of kidney    Family history of malignant neoplasm of breast    Hyperlipidemia    Hypertension    Obesity    OSA (obstructive sleep apnea)    Rosacea    Unspecified otitis media    Vitamin D deficiency     Social History   Socioeconomic History   Marital status: Married    Spouse name: Not on file   Number of children: 2   Years of education: Not on file   Highest education level: Not on file  Occupational History   Occupation: RN  Tobacco Use   Smoking status: Never Smoker   Smokeless tobacco: Never Used  Substance and Sexual Activity   Alcohol use: No   Drug use: No   Sexual activity: Not on file  Other Topics Concern   Not on file  Social History Narrative   Not on file   Social Determinants of Health   Financial Resource Strain: Not on file  Food Insecurity: Not on file  Transportation Needs: Not on file  Physical Activity: Not on file  Stress: Not on file  Social Connections: Not on file     FAMILY HISTORY:  We obtained a detailed, 4-generation family history.  Significant diagnoses are listed below:  Family History  Problem Relation Age of Onset   Breast cancer Sister 40   Breast cancer Maternal Aunt 16   Breast cancer Paternal Grandmother 47   Breast cancer Paternal Aunt 54   Breast cancer Sister 51  identical twin sister   Basal cell carcinoma Sister    Lung cancer Paternal Uncle        dx late 53s   Breast cancer Cousin        dx 61s; maternal cousin   Breast cancer Cousin        early 62s; paternal cousin   Breast cancer Cousin 67       paternal cousin   Breast cancer Maternal Aunt 60   Breast cancer Maternal Aunt 32   Melanoma Father    Colon cancer Other 39       maternal aunt's granddaughter    Breast cancer Cousin        dx 37s; maternal cousin   Basal cell carcinoma Nephew        onset before age 7    Madison Wheeler has one son and one daughter, both in their 68s.   Madison Wheeler has an identical twin sister with a history of breast cancer at age 24 and a history of basal cell carcinoma.  Her genetics were reportedly negative two years ago.  Madison Wheeler other sister was diagnosed with breast cancer at age 49 and passed away at age 43.    Madison Wheeler mother died at age 13 and did not have cancer.  Three of Madison Wheeler maternal aunts had breast cancer (mid 50s-early 54s).  Two of Madison Wheeler maternal cousins had breast cancer (dx 33s, dx 39s).    Madison Wheeler father (d. 72) had a history of melanoma, which was surgically removed.  Madison Wheeler had a paternal aunt (dx 26), two paternal cousins (dx 24s, dx 71), and paternal grandmother (dx 84) with breast cancer.   Madison Wheeler is unaware of previous family history of genetic testing for hereditary cancer risks besides that mentioned above. Patient's maternal ancestors are of White/Caucasian descent, and paternal ancestors are of White/Caucasian descent. There is no reported Ashkenazi Jewish ancestry. There is no known consanguinity.  GENETIC COUNSELING ASSESSMENT: Madison Wheeler is a 65 y.o. female with a personal and family history of cancer which is somewhat suggestive of a hereditary cancer syndrome and predisposition to cancer given the presence of related cancers in the family at a young age. We, therefore, discussed and recommended the following at today's visit.   DISCUSSION: We discussed that 5 - 10% of cancer is hereditary, with most cases of hereditary breast cancer associated with mutations in BRCA1/2.  There are other genes that can be associated with hereditary breast cancer syndromes.  These include but are not limited to ATM, PALB2, and CHEK2. Type of cancer risk and level of risk are gene-specific. We discussed that testing is beneficial for several reasons including knowing how to follow individuals after completing their treatment, identifying whether potential treatment options would be beneficial, and  understanding if other family members could be at risk for cancer and allowing them to undergo genetic testing. Given that Madison Wheeler had genetic testing for hereditary cancer previously, we discussed the differences between the testing that was performed in 2011 and genetic testing offered today.  We discussed that in addition to BRCA1/2, which Madison Wheeler had been tested for previously, additional genes have been found to be associated with breast cancer.  Furthermore, we discussed that more comprehensive testing is available, such as comprehensive rearrangement testing and  RNA testing.   We reviewed the characteristics, features and inheritance patterns of hereditary cancer syndromes. We also discussed genetic testing, including the appropriate family members to  test, the process of testing, insurance coverage and turn-around-time for results. We discussed the implications of a negative, positive and/or variant of uncertain significant result. In order to get genetic test results in a timely manner so that Madison Wheeler can use these genetic test results for surgical decisions, we recommended Madison Wheeler pursue genetic testing for the The BRCAplus panel offered by [facility] and includes sequencing and deletion/duplication analysis for the following 8 genes: ATM, BRCA1, BRCA2, CDH1, CHEK2, PALB2, PTEN, and TP53. Once complete, we recommend Ms. Barna pursue reflex genetic testing to a more comprehensive gene panel.   The CancerNext-Expanded gene panel offered by [facility] and includes sequencing, rearrangement, and RNA analysis for the following 77 genes: AIP, ALK, APC, ATM, AXIN2, BAP1, BARD1, BLM, BMPR1A, BRCA1, BRCA2, BRIP1, CDC73, CDH1, CDK4, CDKN1B, CDKN2A, CHEK2, CTNNA1, DICER1, FANCC, FH, FLCN, GALNT12, KIF1B, LZTR1, MAX, MEN1, MET, MLH1, MSH2, MSH3, MSH6, MUTYH, NBN, NF1, NF2, NTHL1, PALB2, PHOX2B, PMS2, POT1, PRKAR1A, PTCH1, PTEN, RAD51C, RAD51D, RB1, RECQL, RET, SDHA, SDHAF2, SDHB, SDHC, SDHD,  SMAD4, SMARCA4, SMARCB1, SMARCE1, STK11, SUFU, TMEM127, TP53, TSC1, TSC2, VHL and XRCC2 (sequencing and deletion/duplication); EGFR, EGLN1, HOXB13, KIT, MITF, PDGFRA, POLD1, and POLE (sequencing only); EPCAM and GREM1 (deletion/duplication only).   Based on Ms. Hlavaty personal and family history of cancer, she meets medical criteria for updated genetic testing. Because there are other genes known to increase breast cancer risk that she has not had testing for, and because mutations in these genes may impact medical management, it is appropriate to pursue updated genetic testing for other hereditary breast cancer genes.  Of note, this testing did not include large genomic rearrangement analysis of the BRCA genes and was thus incomplete. Additionally, there are other genes that are known to increase breast and ovarian cancer risk and mutations in these genes may impact medical management.   Despite that she meets criteria, she may still have an out of pocket cost. We discussed that if her out of pocket cost for testing is over $100, the laboratory should contact them to discuss self-pay prices, patient pay assistance programs, if applicable, and other billing options.   PLAN: After considering the risks, benefits, and limitations, Ms. Driscoll provided informed consent to pursue genetic testing and the blood sample was sent to [facility] for analysis of the BRCAPlus + CancerNext-Expanded +RNAinsight Panels. Results should be available within approximately 1-2 weeks' time, at which point they will be disclosed by telephone to Ms. Gibas, as will any additional recommendations warranted by these results. Ms. Pfefferle will receive a summary of her genetic counseling visit and a copy of her results once available. This information will also be available in Epic.   Lastly, we encouraged Ms. Coppess to remain in contact with cancer genetics annually so that we can continuously update the family history and  inform her of any changes in cancer genetics and testing that may be of benefit for this family.   Ms. Hustead questions were answered to her satisfaction today. Our contact information was provided should additional questions or concerns arise. Thank you for the referral and allowing [place] to share in the care of your patient.   Wilmary Levit M. Joette Catching, [place], [facility] Genetic Counselor [email or web address] (P) [phone]  The patient was seen for a total of 20 minutes in face-to-face counseling.  Drs. Magrinat, Lindi Adie and/or Burr Medico were available to discuss this case as needed.  _______________________________________________________________________ For Office Staff:  Number of people involved in session: 1 Was an Intern/ student involved  with case: no

## 2020-10-21 NOTE — Progress Notes (Signed)
Radiation Oncology         [phone] ________________________________  Initial Outpatient Consultation  Name: Madison Wheeler MRN: [id number]  Date: 10/21/2020  DOB: 1956-02-28  [id number], Clarita Crane., MD  Erroll Luna, MD   REFERRING PHYSICIAN: Erroll Luna, MD  DIAGNOSIS:    ICD-10-CM   1. Ductal carcinoma in situ (DCIS) of right breast  D05.11     Cancer Staging Ductal carcinoma in situ (DCIS) of right breast Staging form: Breast, AJCC 8th Edition - Clinical stage from 10/21/2020: Stage 0 (cTis (DCIS), cN0, cM0, G3, ER+, PR+, HER2: Not Assessed) - Signed by Nicholas Lose, MD on 10/21/2020 Stage prefix: Initial diagnosis Laterality: Right Staged by: Pathologist and managing physician Stage used in treatment planning: Yes National guidelines used in treatment planning: Yes Type of national guideline used in treatment planning: NCCN   Stage 0 Right Breast LOQ DCIS, ER+ / PR+, Grade 3  CHIEF COMPLAINT: Here to discuss management of right breast DCIS  HISTORY OF PRESENT ILLNESS::Madison Wheeler is a 65 y.o. female who is present here today with her husband and her identical twin sister.  The patient has an extensive family notable for breast cancer.  She reports that family members that underwent breast conserving surgery had recurrences of the breast cancer.  The patient is a retired [employer] scientist.  She now takes care of her husband full-time as he has a history of stroke.  She was recently diagnosed with right breast lower outer quadrant high-grade DCIS that is ER/PR positive.  She has not undergone genetic testing for about 11 years.  Her identical twin reports that her own genetic testing was recently negative.  The patient is fairly adamant that she wants to undergo bilateral mastectomies.  PREVIOUS RADIATION THERAPY: No  PAST MEDICAL HISTORY:  has a past medical history of Acute bronchitis, Anxiety, Calculus of kidney, Family history of malignant neoplasm of breast, Hyperlipidemia,  Hypertension, Obesity, OSA (obstructive sleep apnea), Rosacea, Unspecified otitis media, and Vitamin D deficiency.    PAST SURGICAL HISTORY: Past Surgical History:  Procedure Laterality Date  . ABDOMINAL HYSTERECTOMY    . APPENDECTOMY    . CESAREAN SECTION    . CHOLECYSTECTOMY  2005  . TUBAL LIGATION    . VESICOVAGINAL FISTULA CLOSURE W/ TAH  1986    FAMILY HISTORY: family history includes Breast cancer in her maternal grandmother; Breast cancer (age of onset: 104) in her sister; Breast cancer (age of onset: 12) in her cousin and maternal aunt; Breast cancer (age of onset: 67) in her cousin and maternal aunt; Breast cancer (age of onset: 49) in her maternal aunt; Breast cancer (age of onset: 31) in her sister; Breast cancer (age of onset: 54) in her cousin; Breast cancer (age of onset: 60) in her paternal aunt; Breast cancer (age of onset: 44) in her paternal grandmother; Colon cancer in her paternal uncle; Lung cancer in her paternal uncle.  SOCIAL HISTORY:  reports that she has never smoked. She has never used smokeless tobacco. She reports that she does not drink alcohol and does not use drugs.  ALLERGIES: Patient has no known allergies.  MEDICATIONS:  Current Outpatient Medications  Medication Sig Dispense Refill  . Ascorbic Acid (VITAMIN C PO) Take 1,000 mg by mouth daily.    Marland Kitchen atorvastatin (LIPITOR) 10 MG tablet Take 10 mg by mouth daily.    . cloNIDine (CATAPRES) 0.2 MG tablet Take 0.2 mg by mouth 2 (two) times daily.    Marland Kitchen esomeprazole (NEXIUM) 40 MG capsule  Take 30-60 min before first meal of the day 30 capsule 1   No current facility-administered medications for this encounter.    REVIEW OF SYSTEMS: As above  PHYSICAL EXAM:  vitals were not taken for this visit.   General: Alert and oriented, in no acute distress Heart RRR Chest CTAB Breasts:  No palpable masses appreciated in the breasts or axillae bilaterally.    ECOG = 0  0 - Asymptomatic (Fully active, able to  carry on all predisease activities without restriction)  1 - Symptomatic but completely ambulatory (Restricted in physically strenuous activity but ambulatory and able to carry out work of a light or sedentary nature. For example, light housework, office work)  2 - Symptomatic, <50% in bed during the day (Ambulatory and capable of all self care but unable to carry out any work activities. Up and about more than 50% of waking hours)  3 - Symptomatic, >50% in bed, but not bedbound (Capable of only limited self-care, confined to bed or chair 50% or more of waking hours)  4 - Bedbound (Completely disabled. Cannot carry on any self-care. Totally confined to bed or chair)  5 - Death   Eustace Pen MM, Creech RH, Tormey DC, et al. [phone]). "Toxicity and response criteria of the [facility]". [place] Oncol. 5 (6): 649-55   LABORATORY DATA:  Lab Results  Component Value Date   WBC 8.0 10/21/2020   HGB 13.8 10/21/2020   HCT 40.8 10/21/2020   MCV 80.6 10/21/2020   PLT 315 10/21/2020   CMP     Component Value Date/Time   NA 140 10/21/2020 1210   K 4.0 10/21/2020 1210   CL 107 10/21/2020 1210   CO2 23 10/21/2020 1210   GLUCOSE 101 (H) 10/21/2020 1210   BUN 11 10/21/2020 1210   CREATININE 0.72 10/21/2020 1210   CALCIUM 9.3 10/21/2020 1210   PROT 7.4 10/21/2020 1210   ALBUMIN 4.1 10/21/2020 1210   AST 19 10/21/2020 1210   ALT 25 10/21/2020 1210   ALKPHOS 137 (H) 10/21/2020 1210   BILITOT 0.5 10/21/2020 1210   GFRNONAA >60 10/21/2020 1210         RADIOGRAPHY: MM CLIP PLACEMENT RIGHT  Result Date: 10/09/2020 CLINICAL DATA:  Post stereotactic guided biopsy of calcifications in the lower central to slightly lower outer right breast. EXAM: DIAGNOSTIC RIGHT MAMMOGRAM POST STEREOTACTIC BIOPSY COMPARISON:  Previous exams. FINDINGS: Mammographic images were obtained following stereotactic guided biopsy of calcifications in the lower central to slightly lower outer right  breast. A coil shaped biopsy marking clip is present at the site of the biopsied calcifications in the lower central to slightly lower outer right breast. IMPRESSION: Coil shaped biopsy marking clip at site of biopsied calcifications in the lower central to slightly lower outer right breast. Final Assessment: Post Procedure Mammograms for Marker Placement Electronically Signed   By: Everlean Alstrom M.D.   On: 10/09/2020 11:28   MM RT BREAST BX W LOC DEV 1ST LESION IMAGE BX SPEC STEREO GUIDE  Addendum Date: 10/13/2020   ADDENDUM REPORT: 10/13/2020 08:15 ADDENDUM: Pathology revealed HIGH GRADE DUCTAL CARCINOMA IN SITU, SCLEROSING ADENOSIS of the RIGHT breast, lower outer quadrant. This was found to be concordant by Dr. Everlean Alstrom. Pathology results were discussed with the patient by telephone. The patient reported doing well after the biopsy with tenderness at the site. Post biopsy instructions and care were reviewed and questions were answered. The patient was encouraged to call [facility] for any additional concerns. Per patient request, The patient was referred to [facility] on October 21, 2020. Consider Breast MRI given high grade histology. Pathology results reported by Stacie Acres RN on 10/13/2020. Electronically Signed   By: Everlean Alstrom M.D.   On: 10/13/2020 08:15   Result Date: 10/13/2020 CLINICAL DATA:  65 year old female with suspicious calcifications in the lower central to slightly outer right breast. EXAM: RIGHT BREAST STEREOTACTIC CORE NEEDLE BIOPSY COMPARISON:  Previous exams. FINDINGS: The patient and I discussed the procedure of stereotactic-guided biopsy including benefits and alternatives. We discussed the high likelihood of a successful procedure. We discussed the risks of the procedure including infection, bleeding, tissue injury, clip migration, and inadequate sampling. Informed  written consent was given. The usual time out protocol was performed immediately prior to the procedure. Using sterile technique and 1% Lidocaine as local anesthetic, under stereotactic guidance, a 9 gauge vacuum assisted device was used to perform core needle biopsy of the calcifications in the lower central to slightly lower outer right breast using a lateral to medial approach. Specimen radiograph was performed showing the presence of calcifications. Specimens with calcifications are identified for pathology. Lesion quadrant: Lower outer At the conclusion of the procedure, a coil shaped tissue marker clip was deployed into the biopsy cavity. Follow-up 2-view mammogram was performed and dictated separately. IMPRESSION: Stereotactic-guided biopsy of the calcifications in the lower central to slightly outer right breast. No apparent complications. Electronically Signed: By: Everlean Alstrom M.D. On: 10/09/2020 11:07      IMPRESSION/PLAN: Right breast DCIS  She understands that she is a candidate for breast conserving surgery.  She also understands that she is a candidate for genetic testing.  She understands that if her genetic testing is negative we cannot provide any data to demonstrate a benefit in her life expectancy if she undergoes bilateral mastectomies.  However, given the strong trends of breast cancer in her family, the patient reports that she will be at peace if she undergoes bilateral mastectomies.  This is understandable.  We did talk about the logistics risks benefits and side effects of radiation therapy to the right breast if she undergoes breast conserving surgery.  However, I expect she will ultimately decide to undergo bilateral mastectomies.  She sees her surgeon later today.  I will see her back on an as-needed basis.  It is highly unlikely that she would need postmastectomy radiation.  I wished her the very best.  On date of service, in total, I spent 35 minutes on this encounter.  Patient was seen in person.   __________________________________________   Eppie Gibson, MD  This document serves as a record of services personally performed by Eppie Gibson, MD. It was created on his behalf by Clerance Lav, a trained medical scribe. The creation of this record is based on the scribe's personal observations and the provider's statements to them. This document has been checked and approved by the attending provider.

## 2020-10-21 NOTE — Assessment & Plan Note (Signed)
10/13/2020:Screening mammogram showed right breast calcifications. Diagnostic mammogram showed right breast calcifications spanning 0.9cm and enlarged left axillary lymph nodes. 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  Pathology review: I discussed with the patient the difference between DCIS and invasive breast cancer. It is considered a precancerous lesion. DCIS is classified as a 0. It is generally detected through mammograms as calcifications. We discussed the significance of grades and its impact on prognosis. We also discussed the importance of ER and PR receptors and their implications to adjuvant treatment options. Prognosis of DCIS dependence on grade, comedo necrosis. It is anticipated that if not treated, 20-30% of DCIS can develop into invasive breast cancer.  Recommendation: 1. Breast conserving surgery 2. Followed by adjuvant radiation therapy 3. Followed by antiestrogen therapy with tamoxifen 5 years 4.  Genetics (sister age 101, 2 grandmothers and 2 aunts had breast cancer)  Tamoxifen counseling: We discussed the risks and benefits of tamoxifen. These include but not limited to insomnia, hot flashes, mood changes, vaginal dryness, and weight gain. Although rare, serious side effects including endometrial cancer, risk of blood clots were also discussed. We strongly believe that the benefits far outweigh the risks. Patient understands these risks and consented to starting treatment. Planned treatment duration is 5 years.  Return to clinic after surgery to discuss the final pathology report and come up with an adjuvant treatment plan.

## 2020-10-21 NOTE — Patient Instructions (Signed)

## 2020-10-21 NOTE — Progress Notes (Signed)
[place] Psychosocial Distress Screening Counseling Intern  Counseling intern was referred by distress screening protocol.  The patient scored a 1 on the Psychosocial Distress Thermometer which indicates mild distress. Counseling intern met with patient in exam room" to assess for distress and other psychosocial needs. Patient attended clinic with her husband and twin sister. 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 The patient reported being busy, but not stressed or anxious at all. There is a large family history, so the patient was expecting this diagnosis eventually. Overall, the patient is doing well and "not losing any sleep."  ONCBCN DISTRESS SCREENING 10/21/2020  Screening Type Initial Screening  Distress experienced in past week (1-10) 1  Referral to support programs Yes   Follow up needed: No.  Gaylyn Rong Counseling Intern

## 2020-10-21 NOTE — H&P (Signed)
Madison Wheeler Appointment: 10/21/2020 1:00 PM Location: [facility] Patient #: [id number] DOB: 03-22-1956 Undefined / Language: Madison Wheeler / Race: White Female  History of Present Illness Madison Moores A. Reef Achterberg MD; 10/21/2020 2:50 PM) Patient words: Pt presents to the [facility] for abnormal mammogram showing right breast microcalcifications - core bx high grade DCIS  History of multiple family members with breast cancer- genetics 2011 negative for BRCA  No other complaints of pain discharge or mass.  The patient is a 65 year old female.   Past Surgical History Madison Slipper, RN; 10/21/2020 8:26 AM) Cesarean Section - Multiple Gallbladder Surgery - Laparoscopic Hysterectomy (not due to cancer) - Partial  Diagnostic Studies History Madison Slipper, RN; 10/21/2020 8:26 AM) Colonoscopy 1-5 years ago Mammogram 1-3 years ago  Medication History Madison Slipper, RN; 10/21/2020 8:26 AM) Medications Reconciled  Social History Madison Slipper, RN; 10/21/2020 8:26 AM) Caffeine use Carbonated beverages. No alcohol use No drug use Tobacco use Never smoker.  Family History Madison Slipper, RN; 10/21/2020 8:26 AM) Arthritis Father, Mother. Breast Cancer Family Members In General, Sister. Heart Disease Family Members In General. Hypertension Father, Mother. Thyroid problems Mother.  Pregnancy / Birth History Madison Slipper, RN; 10/21/2020 8:26 AM) Age at menarche 24 years. Age of menopause <44 Gravida 2 Irregular periods Length (months) of breastfeeding 3-6 Maternal age 54-30 Para 2  Other Problems Madison Slipper, RN; 10/21/2020 8:26 AM) Arthritis Cholelithiasis High blood pressure Kidney Stone Sleep Apnea Transfusion history     Review of Systems Madison Slipper RN; 10/21/2020 8:26 AM) General Not Present- Appetite Loss, Chills, Fatigue, Fever, Night Sweats, Weight Gain and Weight Loss. Skin Not Present- Change in Wart/Mole, Dryness, Hives, Jaundice, New Lesions, Non-Healing Wounds,  Rash and Ulcer. HEENT Not Present- Earache, Hearing Loss, Hoarseness, Nose Bleed, Oral Ulcers, Ringing in the Ears, Seasonal Allergies, Sinus Pain, Sore Throat, Visual Disturbances, Wears glasses/contact lenses and Yellow Eyes. Respiratory Not Present- Bloody sputum, Chronic Cough, Difficulty Breathing, Snoring and Wheezing. Breast Not Present- Breast Mass, Breast Pain, Nipple Discharge and Skin Changes. Cardiovascular Not Present- Chest Pain, Difficulty Breathing Lying Down, Leg Cramps, Palpitations, Rapid Heart Rate, Shortness of Breath and Swelling of Extremities. Gastrointestinal Not Present- Abdominal Pain, Bloating, Bloody Stool, Change in Bowel Habits, Chronic diarrhea, Constipation, Difficulty Swallowing, Excessive gas, Gets full quickly at meals, Hemorrhoids, Indigestion, Nausea, Rectal Pain and Vomiting. Female Genitourinary Present- Nocturia. Not Present- Frequency, Painful Urination, Pelvic Pain and Urgency. Musculoskeletal Not Present- Back Pain, Joint Pain, Joint Stiffness, Muscle Pain, Muscle Weakness and Swelling of Extremities. Neurological Not Present- Decreased Memory, Fainting, Headaches, Numbness, Seizures, Tingling, Tremor, Trouble walking and Weakness. Psychiatric Not Present- Anxiety, Bipolar, Change in Sleep Pattern, Depression, Fearful and Frequent crying. Endocrine Not Present- Cold Intolerance, Excessive Hunger, Hair Changes, Heat Intolerance, Hot flashes and New Diabetes. Hematology Not Present- Blood Thinners, Easy Bruising, Excessive bleeding, Gland problems, HIV and Persistent Infections.   Physical Exam (Madison Hitt A. Zuri Lascala MD; 10/21/2020 2:51 PM)  General Mental Status-Alert. General Appearance-Consistent with stated age. Hydration-Well hydrated. Voice-Normal.  Breast Note: no masses bilaterally scar on left noted no nipple discharge  Cardiovascular Cardiovascular examination reveals -normal heart sounds, regular rate and rhythm with no murmurs and  normal pedal pulses bilaterally.  Neurologic Neurologic evaluation reveals -alert and oriented x 3 with no impairment of recent or remote memory. Mental Status-Normal.  Lymphatic Head & Neck  General Head & Neck Lymphatics: Bilateral - Description - Normal. Axillary  General Axillary Region: Bilateral - Description - Normal. Tenderness - Non Tender.  Assessment & Plan (Madison Kolander A. Samaj Wessells MD; 10/21/2020 2:53 PM)  BREAST NEOPLASM, TIS (DCIS), RIGHT (D05.11) Impression: PT has opted for bilateral simple mastectomies and right SLN mapping given family history pro and cons of this approached discussed  Risk of sentinel lymph node mapping include bleeding, infection, lymphedema, shoulder pain. stiffness, dye allergy. cosmetic deformity , blood clots, death, need for more surgery. Pt agres to proceed. Discussed treatment options for breast cancer to include breast conservation vs mastectomy with reconstruction. Pt has decided on mastectomy. Risk include bleeding, infection, flap necrosis, pain, numbness, recurrence, hematoma, other surgery needs. Pt understands and agrees to proceed.  Current Plans You are being scheduled for surgery- Our schedulers will call you.  You should hear from our office's scheduling department within 5 working days about the location, date, and time of surgery. We try to make accommodations for patient's preferences in scheduling surgery, but sometimes the OR schedule or the surgeon's schedule prevents [place] from making those accommodations.  If you have not heard from our office [phone]) in 5 working days, call the office and ask for your surgeon's nurse.  If you have other questions about your diagnosis, plan, or surgery, call the office and ask for your surgeon's nurse.  Pt Education - [facility] Breast Cancer Information Given - [facility] "Breast Journey" Package Pt Education - flb breast cancer surgery: discussed with patient and provided information. Pt  Education - [facility] Mastectomy HCI  FAMILY HISTORY OF BREAST CANCER (Z80.3)

## 2020-10-22 ENCOUNTER — Encounter: Payer: Self-pay | Admitting: Genetic Counselor

## 2020-10-22 DIAGNOSIS — Z801 Family history of malignant neoplasm of trachea, bronchus and lung: Secondary | ICD-10-CM

## 2020-10-22 DIAGNOSIS — Z803 Family history of malignant neoplasm of breast: Secondary | ICD-10-CM

## 2020-10-22 DIAGNOSIS — Z808 Family history of malignant neoplasm of other organs or systems: Secondary | ICD-10-CM

## 2020-10-22 HISTORY — DX: Family history of malignant neoplasm of other organs or systems: Z80.8

## 2020-10-22 HISTORY — DX: Family history of malignant neoplasm of breast: Z80.3

## 2020-10-22 HISTORY — DX: Family history of malignant neoplasm of trachea, bronchus and lung: Z80.1

## 2020-10-23 ENCOUNTER — Telehealth: Payer: Self-pay | Admitting: Hematology and Oncology

## 2020-10-23 NOTE — Telephone Encounter (Signed)
Scheduled appt per 3/11 sch msg. Pt aware.

## 2020-10-28 ENCOUNTER — Encounter (HOSPITAL_BASED_OUTPATIENT_CLINIC_OR_DEPARTMENT_OTHER): Payer: Self-pay | Admitting: Surgery

## 2020-10-28 ENCOUNTER — Other Ambulatory Visit: Payer: Self-pay

## 2020-10-29 ENCOUNTER — Encounter (HOSPITAL_BASED_OUTPATIENT_CLINIC_OR_DEPARTMENT_OTHER)
Admission: RE | Admit: 2020-10-29 | Discharge: 2020-10-29 | Disposition: A | Discharge status: Home / Self Care | Payer: Medicare Other | Source: Ambulatory Visit | Attending: Surgery | Admitting: Surgery

## 2020-10-29 ENCOUNTER — Telehealth: Payer: Self-pay | Admitting: *Deleted

## 2020-10-29 ENCOUNTER — Encounter: Payer: Self-pay | Admitting: *Deleted

## 2020-10-29 DIAGNOSIS — Z01810 Encounter for preprocedural cardiovascular examination: Secondary | ICD-10-CM | POA: Diagnosis present

## 2020-10-29 NOTE — Progress Notes (Signed)

## 2020-10-29 NOTE — Telephone Encounter (Signed)
Left message on patient's voicemail to follow up from [facility] 3/9.

## 2020-10-31 ENCOUNTER — Other Ambulatory Visit (HOSPITAL_COMMUNITY)
Admission: RE | Admit: 2020-10-31 | Discharge: 2020-10-31 | Disposition: A | Discharge status: Home / Self Care | Payer: Medicare Other | Source: Ambulatory Visit | Attending: Surgery | Admitting: Surgery

## 2020-10-31 DIAGNOSIS — Z01812 Encounter for preprocedural laboratory examination: Secondary | ICD-10-CM | POA: Diagnosis present

## 2020-10-31 DIAGNOSIS — Z20822 Contact with and (suspected) exposure to covid-19: Secondary | ICD-10-CM | POA: Insufficient documentation

## 2020-10-31 LAB — SARS CORONAVIRUS 2 (TAT 6-24 HRS): SARS Coronavirus 2: NEGATIVE

## 2020-11-04 ENCOUNTER — Ambulatory Visit (HOSPITAL_BASED_OUTPATIENT_CLINIC_OR_DEPARTMENT_OTHER): Payer: Medicare Other | Admitting: Certified Registered Nurse Anesthetist

## 2020-11-04 ENCOUNTER — Encounter (HOSPITAL_BASED_OUTPATIENT_CLINIC_OR_DEPARTMENT_OTHER)
Admission: RE | Disposition: A | Discharge status: Home / Self Care | Payer: Self-pay | Source: Ambulatory Visit | Attending: Surgery

## 2020-11-04 ENCOUNTER — Other Ambulatory Visit: Payer: Self-pay

## 2020-11-04 ENCOUNTER — Encounter (HOSPITAL_COMMUNITY)
Admission: RE | Admit: 2020-11-04 | Discharge: 2020-11-04 | Disposition: A | Discharge status: Home / Self Care | Payer: Medicare Other | Source: Ambulatory Visit | Attending: Surgery | Admitting: Surgery

## 2020-11-04 ENCOUNTER — Ambulatory Visit (HOSPITAL_BASED_OUTPATIENT_CLINIC_OR_DEPARTMENT_OTHER)
Admission: RE | Admit: 2020-11-04 | Discharge: 2020-11-05 | Disposition: A | Discharge status: Home / Self Care | Payer: Medicare Other | Source: Ambulatory Visit | Attending: Surgery | Admitting: Surgery

## 2020-11-04 ENCOUNTER — Encounter (HOSPITAL_BASED_OUTPATIENT_CLINIC_OR_DEPARTMENT_OTHER): Payer: Self-pay | Admitting: Surgery

## 2020-11-04 DIAGNOSIS — N6021 Fibroadenosis of right breast: Secondary | ICD-10-CM | POA: Insufficient documentation

## 2020-11-04 DIAGNOSIS — N6022 Fibroadenosis of left breast: Secondary | ICD-10-CM | POA: Diagnosis not present

## 2020-11-04 DIAGNOSIS — Z8349 Family history of other endocrine, nutritional and metabolic diseases: Secondary | ICD-10-CM | POA: Diagnosis not present

## 2020-11-04 DIAGNOSIS — D0511 Intraductal carcinoma in situ of right breast: Secondary | ICD-10-CM | POA: Insufficient documentation

## 2020-11-04 DIAGNOSIS — Z6841 Body Mass Index (BMI) 40.0 and over, adult: Secondary | ICD-10-CM | POA: Diagnosis not present

## 2020-11-04 DIAGNOSIS — Z803 Family history of malignant neoplasm of breast: Secondary | ICD-10-CM | POA: Diagnosis not present

## 2020-11-04 DIAGNOSIS — Z8041 Family history of malignant neoplasm of ovary: Secondary | ICD-10-CM | POA: Diagnosis not present

## 2020-11-04 DIAGNOSIS — N6081 Other benign mammary dysplasias of right breast: Secondary | ICD-10-CM | POA: Insufficient documentation

## 2020-11-04 DIAGNOSIS — Z8261 Family history of arthritis: Secondary | ICD-10-CM | POA: Insufficient documentation

## 2020-11-04 DIAGNOSIS — Z8249 Family history of ischemic heart disease and other diseases of the circulatory system: Secondary | ICD-10-CM | POA: Diagnosis not present

## 2020-11-04 HISTORY — DX: Unspecified osteoarthritis, unspecified site: M19.90

## 2020-11-04 HISTORY — PX: SENTINEL NODE BIOPSY: SHX6608

## 2020-11-04 HISTORY — PX: SIMPLE MASTECTOMY WITH AXILLARY SENTINEL NODE BIOPSY: SHX6098

## 2020-11-04 SURGERY — SIMPLE MASTECTOMY
Anesthesia: General | Site: Breast | Laterality: Right

## 2020-11-04 MED ORDER — SODIUM CHLORIDE 0.9 % IV SOLN
INTRAVENOUS | Status: AC
  Filled 2020-11-04: qty 10

## 2020-11-04 MED ORDER — SODIUM CHLORIDE 0.9% FLUSH: 3.0000 mL | INTRAVENOUS | Status: DC | PRN

## 2020-11-04 MED ORDER — DEXTROSE-NACL 5-0.9 % IV SOLN: INTRAVENOUS | Status: DC

## 2020-11-04 MED ORDER — VANCOMYCIN HCL 500 MG IV SOLR
INTRAVENOUS | Status: DC | PRN
  Administered 2020-11-04: 1000 mg via TOPICAL

## 2020-11-04 MED ORDER — ACETAMINOPHEN 325 MG PO TABS: 650.0000 mg | ORAL_TABLET | ORAL | Status: DC | PRN

## 2020-11-04 MED ORDER — DEXAMETHASONE SODIUM PHOSPHATE 10 MG/ML IJ SOLN
INTRAMUSCULAR | Status: DC | PRN
  Administered 2020-11-04: 10 mg via INTRAVENOUS

## 2020-11-04 MED ORDER — GLYCOPYRROLATE PF 0.2 MG/ML IJ SOSY
PREFILLED_SYRINGE | INTRAMUSCULAR | Status: AC
  Filled 2020-11-04: qty 1

## 2020-11-04 MED ORDER — KETOROLAC TROMETHAMINE 30 MG/ML IJ SOLN
30.0000 mg | Freq: Four times a day (QID) | INTRAMUSCULAR | Status: DC
  Administered 2020-11-04 – 2020-11-05 (×3): 30 mg via INTRAVENOUS
  Filled 2020-11-04 (×3): qty 1

## 2020-11-04 MED ORDER — FENTANYL CITRATE (PF) 100 MCG/2ML IJ SOLN
INTRAMUSCULAR | Status: AC
  Filled 2020-11-04: qty 2

## 2020-11-04 MED ORDER — SODIUM CHLORIDE 0.9% FLUSH
3.0000 mL | Freq: Two times a day (BID) | INTRAVENOUS | Status: DC
  Administered 2020-11-04: 3 mL via INTRAVENOUS

## 2020-11-04 MED ORDER — ACETAMINOPHEN 325 MG RE SUPP: 650.0000 mg | RECTAL | Status: DC | PRN

## 2020-11-04 MED ORDER — AMISULPRIDE (ANTIEMETIC) 5 MG/2ML IV SOLN
10.0000 mg | Freq: Once | INTRAVENOUS | Status: AC | PRN
  Administered 2020-11-04: 10 mg via INTRAVENOUS

## 2020-11-04 MED ORDER — CHLORHEXIDINE GLUCONATE CLOTH 2 % EX PADS: 6.0000 | MEDICATED_PAD | Freq: Once | CUTANEOUS | Status: DC

## 2020-11-04 MED ORDER — ONDANSETRON HCL 4 MG/2ML IJ SOLN
INTRAMUSCULAR | Status: DC | PRN
  Administered 2020-11-04: 4 mg via INTRAVENOUS

## 2020-11-04 MED ORDER — MIDAZOLAM HCL 2 MG/2ML IJ SOLN
INTRAMUSCULAR | Status: AC
  Filled 2020-11-04: qty 2

## 2020-11-04 MED ORDER — LACTATED RINGERS IV SOLN: INTRAVENOUS | Status: DC | PRN

## 2020-11-04 MED ORDER — CLONIDINE HCL 0.2 MG PO TABS
0.2000 mg | ORAL_TABLET | Freq: Two times a day (BID) | ORAL | Status: DC
  Administered 2020-11-04: 0.2 mg via ORAL
  Filled 2020-11-04 (×2): qty 1

## 2020-11-04 MED ORDER — CEFAZOLIN SODIUM-DEXTROSE 2-4 GM/100ML-% IV SOLN
INTRAVENOUS | Status: AC
  Filled 2020-11-04: qty 100

## 2020-11-04 MED ORDER — MIDAZOLAM HCL 2 MG/2ML IJ SOLN
2.0000 mg | Freq: Once | INTRAMUSCULAR | Status: AC
  Administered 2020-11-04: 2 mg via INTRAVENOUS

## 2020-11-04 MED ORDER — VANCOMYCIN HCL 500 MG IV SOLR
INTRAVENOUS | Status: AC
  Filled 2020-11-04: qty 500

## 2020-11-04 MED ORDER — OXYCODONE HCL 5 MG PO TABS: 5.0000 mg | ORAL_TABLET | Freq: Four times a day (QID) | ORAL | 0 refills | Status: DC | PRN

## 2020-11-04 MED ORDER — CEFAZOLIN SODIUM-DEXTROSE 2-4 GM/100ML-% IV SOLN: 2.0000 g | INTRAVENOUS | Status: DC

## 2020-11-04 MED ORDER — HYDROMORPHONE HCL 1 MG/ML IJ SOLN
INTRAMUSCULAR | Status: AC
  Filled 2020-11-04: qty 0.5

## 2020-11-04 MED ORDER — HYDROMORPHONE HCL 1 MG/ML IJ SOLN
0.2500 mg | INTRAMUSCULAR | Status: DC | PRN
  Administered 2020-11-04 (×2): 0.5 mg via INTRAVENOUS

## 2020-11-04 MED ORDER — ACETAMINOPHEN 500 MG PO TABS
ORAL_TABLET | ORAL | Status: AC
  Filled 2020-11-04: qty 2

## 2020-11-04 MED ORDER — FENTANYL CITRATE (PF) 250 MCG/5ML IJ SOLN
INTRAMUSCULAR | Status: DC | PRN
  Administered 2020-11-04 (×2): 25 ug via INTRAVENOUS
  Administered 2020-11-04 (×2): 50 ug via INTRAVENOUS
  Administered 2020-11-04 (×2): 25 ug via INTRAVENOUS

## 2020-11-04 MED ORDER — MIDAZOLAM HCL 2 MG/2ML IJ SOLN
INTRAMUSCULAR | Status: DC | PRN
  Administered 2020-11-04: 2 mg via INTRAVENOUS

## 2020-11-04 MED ORDER — CLINDAMYCIN PHOSPHATE 900 MG/50ML IV SOLN: 900.0000 mg | INTRAVENOUS | Status: DC

## 2020-11-04 MED ORDER — PROMETHAZINE HCL 25 MG/ML IJ SOLN
INTRAMUSCULAR | Status: AC
  Filled 2020-11-04: qty 1

## 2020-11-04 MED ORDER — PHENYLEPHRINE HCL (PRESSORS) 10 MG/ML IV SOLN
INTRAVENOUS | Status: DC | PRN
  Administered 2020-11-04: 120 ug via INTRAVENOUS

## 2020-11-04 MED ORDER — ONDANSETRON HCL 4 MG/2ML IJ SOLN
4.0000 mg | INTRAMUSCULAR | Status: DC | PRN
  Administered 2020-11-04 – 2020-11-05 (×3): 4 mg via INTRAVENOUS
  Filled 2020-11-04 (×2): qty 2

## 2020-11-04 MED ORDER — ONDANSETRON HCL 4 MG/2ML IJ SOLN
INTRAMUSCULAR | Status: AC
  Filled 2020-11-04: qty 2

## 2020-11-04 MED ORDER — OXYCODONE HCL 5 MG PO TABS: 5.0000 mg | ORAL_TABLET | Freq: Once | ORAL | Status: DC | PRN

## 2020-11-04 MED ORDER — OXYCODONE HCL 5 MG PO TABS: 5.0000 mg | ORAL_TABLET | ORAL | Status: DC | PRN

## 2020-11-04 MED ORDER — ACETAMINOPHEN 500 MG PO TABS
1000.0000 mg | ORAL_TABLET | ORAL | Status: AC
  Administered 2020-11-04: 1000 mg via ORAL

## 2020-11-04 MED ORDER — MORPHINE SULFATE (PF) 4 MG/ML IV SOLN: 1.0000 mg | INTRAVENOUS | Status: DC | PRN

## 2020-11-04 MED ORDER — AMISULPRIDE (ANTIEMETIC) 5 MG/2ML IV SOLN
INTRAVENOUS | Status: AC
  Filled 2020-11-04: qty 4

## 2020-11-04 MED ORDER — LIDOCAINE HCL (CARDIAC) PF 100 MG/5ML IV SOSY
PREFILLED_SYRINGE | INTRAVENOUS | Status: DC | PRN
  Administered 2020-11-04: 60 mg via INTRATRACHEAL

## 2020-11-04 MED ORDER — LIDOCAINE 2% (20 MG/ML) 5 ML SYRINGE
INTRAMUSCULAR | Status: AC
  Filled 2020-11-04: qty 5

## 2020-11-04 MED ORDER — FENTANYL CITRATE (PF) 100 MCG/2ML IJ SOLN
100.0000 ug | Freq: Once | INTRAMUSCULAR | Status: AC
  Administered 2020-11-04: 100 ug via INTRAVENOUS

## 2020-11-04 MED ORDER — OXYCODONE HCL 5 MG/5ML PO SOLN
5.0000 mg | Freq: Once | ORAL | Status: DC | PRN
Start: 2020-11-04 — End: 2020-11-04

## 2020-11-04 MED ORDER — GLYCOPYRROLATE 0.2 MG/ML IJ SOLN
INTRAMUSCULAR | Status: DC | PRN
  Administered 2020-11-04 (×2): .1 mg via INTRAVENOUS

## 2020-11-04 MED ORDER — TECHNETIUM TC 99M TILMANOCEPT KIT
1.0000 | PACK | Freq: Once | INTRAVENOUS | Status: AC | PRN
  Administered 2020-11-04: 1 via INTRADERMAL

## 2020-11-04 MED ORDER — SODIUM CHLORIDE 0.9 % IV SOLN
INTRAVENOUS | Status: DC | PRN
  Administered 2020-11-04: 1000 mL

## 2020-11-04 MED ORDER — ROPIVACAINE HCL 5 MG/ML IJ SOLN
INTRAMUSCULAR | Status: DC | PRN
  Administered 2020-11-04: 40 mL via PERINEURAL

## 2020-11-04 MED ORDER — PROMETHAZINE HCL 25 MG/ML IJ SOLN
6.2500 mg | INTRAMUSCULAR | Status: DC | PRN
  Administered 2020-11-04: 6.25 mg via INTRAVENOUS

## 2020-11-04 MED ORDER — PROPOFOL 10 MG/ML IV BOLUS
INTRAVENOUS | Status: DC | PRN
  Administered 2020-11-04: 200 mg via INTRAVENOUS

## 2020-11-04 MED ORDER — CEFAZOLIN SODIUM-DEXTROSE 2-3 GM-%(50ML) IV SOLR
INTRAVENOUS | Status: DC | PRN
  Administered 2020-11-04: 2 g via INTRAVENOUS

## 2020-11-04 MED ORDER — MEPERIDINE HCL 25 MG/ML IJ SOLN: 6.2500 mg | INTRAMUSCULAR | Status: DC | PRN

## 2020-11-04 MED ORDER — LACTATED RINGERS IV SOLN: INTRAVENOUS | Status: DC

## 2020-11-04 MED ORDER — IBUPROFEN 800 MG PO TABS: 800.0000 mg | ORAL_TABLET | Freq: Three times a day (TID) | ORAL | 0 refills | Status: DC | PRN

## 2020-11-04 MED ORDER — SODIUM CHLORIDE 0.9 % IV SOLN: 250.0000 mL | INTRAVENOUS | Status: DC | PRN

## 2020-11-04 SURGICAL SUPPLY — 72 items
ADH SKN CLS APL DERMABOND .7 (GAUZE/BANDAGES/DRESSINGS) ×18
APL PRP STRL LF DISP 70% ISPRP (MISCELLANEOUS) ×2
APL SKNCLS STERI-STRIP NONHPOA (GAUZE/BANDAGES/DRESSINGS)
APPLIER CLIP 11 MED OPEN (CLIP) ×3
APPLIER CLIP 9.375 MED OPEN (MISCELLANEOUS) ×3
APR CLP MED 11 20 MLT OPN (CLIP) ×2
APR CLP MED 9.3 20 MLT OPN (MISCELLANEOUS) ×2
BENZOIN TINCTURE PRP APPL 2/3 (GAUZE/BANDAGES/DRESSINGS) IMPLANT
BINDER BREAST LRG (GAUZE/BANDAGES/DRESSINGS) IMPLANT
BINDER BREAST MEDIUM (GAUZE/BANDAGES/DRESSINGS) IMPLANT
BINDER BREAST XLRG (GAUZE/BANDAGES/DRESSINGS) IMPLANT
BINDER BREAST XXLRG (GAUZE/BANDAGES/DRESSINGS) ×1 IMPLANT
BLADE HEX COATED 2.75 (ELECTRODE) ×2 IMPLANT
BLADE SURG 10 STRL SS (BLADE) ×3 IMPLANT
BLADE SURG 15 STRL LF DISP TIS (BLADE) ×2 IMPLANT
BLADE SURG 15 STRL SS (BLADE) ×3
BNDG ELASTIC 6X5.8 VLCR STR LF (GAUZE/BANDAGES/DRESSINGS) ×2 IMPLANT
CANISTER SUCT 1200ML W/VALVE (MISCELLANEOUS) ×3 IMPLANT
CHLORAPREP W/TINT 26 (MISCELLANEOUS) ×3 IMPLANT
CLIP APPLIE 11 MED OPEN (CLIP) IMPLANT
CLIP APPLIE 9.375 MED OPEN (MISCELLANEOUS) IMPLANT
COVER BACK TABLE 60X90IN (DRAPES) ×3 IMPLANT
COVER MAYO STAND STRL (DRAPES) ×3 IMPLANT
COVER PROBE W GEL 5X96 (DRAPES) ×3 IMPLANT
COVER WAND RF STERILE (DRAPES) IMPLANT
DERMABOND ADVANCED (GAUZE/BANDAGES/DRESSINGS) ×9
DERMABOND ADVANCED .7 DNX12 (GAUZE/BANDAGES/DRESSINGS) ×4 IMPLANT
DRAIN CHANNEL 19F RND (DRAIN) ×6 IMPLANT
DRAPE LAPAROSCOPIC ABDOMINAL (DRAPES) ×3 IMPLANT
DRAPE UTILITY XL STRL (DRAPES) ×4 IMPLANT
DRSG PAD ABDOMINAL 8X10 ST (GAUZE/BANDAGES/DRESSINGS) IMPLANT
DRSG TEGADERM 4X10 (GAUZE/BANDAGES/DRESSINGS) ×2 IMPLANT
ELECT COATED BLADE 2.86 ST (ELECTRODE) ×2 IMPLANT
ELECT REM PT RETURN 9FT ADLT (ELECTROSURGICAL) ×3
ELECTRODE REM PT RTRN 9FT ADLT (ELECTROSURGICAL) ×2 IMPLANT
EVACUATOR SILICONE 100CC (DRAIN) ×6 IMPLANT
GAUZE SPONGE 4X4 12PLY STRL LF (GAUZE/BANDAGES/DRESSINGS) IMPLANT
GLOVE SRG 8 PF TXTR STRL LF DI (GLOVE) ×2 IMPLANT
GLOVE SURG ENC MOIS LTX SZ6.5 (GLOVE) ×2 IMPLANT
GLOVE SURG LTX SZ8 (GLOVE) ×3 IMPLANT
GLOVE SURG UNDER POLY LF SZ7 (GLOVE) ×3 IMPLANT
GLOVE SURG UNDER POLY LF SZ8 (GLOVE) ×3
GOWN STRL REUS W/ TWL LRG LVL3 (GOWN DISPOSABLE) ×4 IMPLANT
GOWN STRL REUS W/ TWL XL LVL3 (GOWN DISPOSABLE) ×2 IMPLANT
GOWN STRL REUS W/TWL LRG LVL3 (GOWN DISPOSABLE) ×6
GOWN STRL REUS W/TWL XL LVL3 (GOWN DISPOSABLE) ×3
NDL HYPO 25X1 1.5 SAFETY (NEEDLE) ×4 IMPLANT
NDL SAFETY ECLIPSE 18X1.5 (NEEDLE) ×2 IMPLANT
NEEDLE HYPO 18GX1.5 SHARP (NEEDLE)
NEEDLE HYPO 25X1 1.5 SAFETY (NEEDLE) ×3 IMPLANT
NS IRRIG 1000ML POUR BTL (IV SOLUTION) IMPLANT
PACK BASIN DAY SURGERY FS (CUSTOM PROCEDURE TRAY) ×3 IMPLANT
PENCIL SMOKE EVACUATOR (MISCELLANEOUS) ×3 IMPLANT
PIN SAFETY STERILE (MISCELLANEOUS) ×3 IMPLANT
SLEEVE SCD COMPRESS KNEE MED (STOCKING) ×3 IMPLANT
SPONGE LAP 18X18 RF (DISPOSABLE) ×6 IMPLANT
STAPLER VISISTAT 35W (STAPLE) ×3 IMPLANT
STRIP CLOSURE SKIN 1/2X4 (GAUZE/BANDAGES/DRESSINGS) IMPLANT
SUT CHROMIC 3 0 SH 27 (SUTURE) IMPLANT
SUT ETHILON 2 0 FS 18 (SUTURE) ×3 IMPLANT
SUT MNCRL AB 3-0 PS2 18 (SUTURE) ×4 IMPLANT
SUT MON AB 4-0 PC3 18 (SUTURE) IMPLANT
SUT SILK 2 0 PERMA HAND 18 BK (SUTURE) ×3 IMPLANT
SUT SILK 2 0 SH (SUTURE) IMPLANT
SUT VIC AB 3-0 54X BRD REEL (SUTURE) ×2 IMPLANT
SUT VIC AB 3-0 BRD 54 (SUTURE) ×3
SUT VICRYL 3-0 CR8 SH (SUTURE) ×6 IMPLANT
SYR CONTROL 10ML LL (SYRINGE) ×5 IMPLANT
TOWEL GREEN STERILE FF (TOWEL DISPOSABLE) ×6 IMPLANT
TRAY FAXITRON CT DISP (TRAY / TRAY PROCEDURE) ×2 IMPLANT
TUBE CONNECTING 20X1/4 (TUBING) ×3 IMPLANT
YANKAUER SUCT BULB TIP NO VENT (SUCTIONS) ×3 IMPLANT

## 2020-11-04 NOTE — Transfer of Care (Signed)
Immediate Anesthesia Transfer of Care Note  Patient: Madison Wheeler  Procedure(s) Performed: BILATERAL SIMPLE MASTECTOMY (Bilateral Breast) RIGHT SENTINEL LYMPH NODE BIOPSY (Right Axilla)  Patient Location: PACU  Anesthesia Type:GA combined with regional for post-op pain  Level of Consciousness: awake, alert , oriented, drowsy and patient cooperative  Airway & Oxygen Therapy: Patient Spontanous Breathing and Patient connected to face mask oxygen  Post-op Assessment: Report given to RN and Post -op Vital signs reviewed and stable  Post vital signs: Reviewed and stable  Last Vitals:  Vitals Value Taken Time  BP    Temp    Pulse    Resp    SpO2      Last Pain:  Vitals:   11/04/20 1149  TempSrc: Oral  PainSc: 0-No pain         Complications: No complications documented.

## 2020-11-04 NOTE — Anesthesia Procedure Notes (Signed)
Procedure Name: LMA Insertion Date/Time: 11/04/2020 1:56 PM Performed by: Kathryne Hitch, CRNA Pre-anesthesia Checklist: Patient identified, Emergency Drugs available, Suction available and Patient being monitored Patient Re-evaluated:Patient Re-evaluated prior to induction Oxygen Delivery Method: Circle system utilized Preoxygenation: Pre-oxygenation with 100% oxygen Induction Type: IV induction Ventilation: Mask ventilation without difficulty LMA: LMA inserted LMA Size: 4.0 Number of attempts: 1 Placement Confirmation: positive ETCO2 and breath sounds checked- equal and bilateral Tube secured with: Tape Dental Injury: Teeth and Oropharynx as per pre-operative assessment

## 2020-11-04 NOTE — Interval H&P Note (Signed)
History and Physical Interval Note:  11/04/2020 1:22 PM  Madison Wheeler  has presented today for surgery, with the diagnosis of BREAST CANCER.  The various methods of treatment have been discussed with the patient and family. After consideration of risks, benefits and other options for treatment, the patient has consented to  Procedure(s) with comments: BILATERAL SIMPLE MASTECTOMY (Bilateral) - PEC BLOCK; START TIME OF 1:15 PM FOR 120 MINUTES ROOM 8 RIGHT SENTINEL NODE BIOPSY (Right) as a surgical intervention.  The patient's history has been reviewed, patient examined, no change in status, stable for surgery.  I have reviewed the patient's chart and labs.  Questions were answered to the patient's satisfaction.  The surgical and non surgical options have been discussed with the patient.  Risks of surgery include bleeding,  Infection,  Flap necrosis,  Tissue loss,  Chronic pain, death, Numbness,  And the need for additional procedures.  Reconstruction options also have been discussed with the patient as well.  The patient agrees to proceed.    [place]

## 2020-11-04 NOTE — Progress Notes (Signed)
Assisted Dr. Sabra Heck with right and left, ultrasound guided, pectoralis blocks. Side rails up, monitors on throughout procedure. See vital signs in flow sheet. Tolerated Procedure well.

## 2020-11-04 NOTE — Anesthesia Procedure Notes (Signed)
Anesthesia Regional Block: Pectoralis block   Pre-Anesthetic Checklist: ,, timeout performed, Correct Patient, Correct Site, Correct Laterality, Correct Procedure, Correct Position, site marked, Risks and benefits discussed,  Surgical consent,  Pre-op evaluation,  At surgeon's request and post-op pain management  Laterality: Left and Right  Prep: chloraprep       Needles:  Injection technique: Single-shot  Needle Type: Stimiplex     Needle Length: 9cm  Needle Gauge: 21     Additional Needles:   Procedures:,,,, ultrasound used (permanent image in chart),,,,  Narrative:  Start time: 11/04/2020 1:14 PM End time: 11/04/2020 1:19 PM Injection made incrementally with aspirations every 5 mL.  Performed by: Personally  Anesthesiologist: Lynda Rainwater, MD

## 2020-11-04 NOTE — Op Note (Signed)
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  She has a very strong family history of breast cancer and ovarian cancer in her family.  She opted for bilateral simple mastectomies based on family history after discussion of the pros and cons of this approach.  She is undergone genetic screening but this was negative change her surgical decision was more for her own families benefit.  We discussed potential complication rates of bleeding and infection and how these can double sometimes when both sides are done especially one done prophylactically.  She understood that risk and wished to proceed.The surgical and non surgical options have been discussed with the patient.  Risks of surgery include bleeding,  Infection,  Flap necrosis,  Tissue loss,  Chronic pain, death, Numbness,  And the need for additional procedures.  Reconstruction options also have been discussed with the patient as well.  The patient agrees to proceed.           Description of procedure: The patient was met in the holding area and questions were answered.  She underwent bilateral pectoral block per anesthesia protocol.  She underwent injection of the right breast with technetium sulfur colloid for mapping.  All questions were answered and the procedure was reviewed.  She was taken back to the operating room.  She is placed supine upon the OR table.  After  induction of general anesthesia, both breasts were prepped and draped in sterile fashion and timeout performed.  Proper patient, site and procedure was verified.  Left side was done first.  Curvilinear incision was made above and below the nipple areolar complex.  A superior skin flap was taken to the clavicle while the inferior skin flap was taken to the inframammary fold.  The breast was then dissected off the chest wall in a medial to lateral fashion into the lateral attachments were encountered and divided.  The axillary contents were left intact.  Hemostasis achieved with cautery.  The right side was then done in similar fashion.  A curvilinear incision was made above below the nipple areolar complex.  Dissection was carried to take the superior skin flap of the clavicle.  Small bleeding vessels were controlled with clips and cautery.  The same was done for the inferior flap to the inframammary fold.  The breast was then dissected off the chest wall in a medial to lateral fashion into the lateral attachments were encountered and these were divided.  Neoprobe was used and 1 hot sentinel node identified in the right axilla and this was removed.  Background counts approached the background activity noted.  Hemostasis achieved with cautery.  Vancomycin powder placed in both wounds after irrigation of both with antibiotic solution.  The skin flaps are viable.  219 round drains were placed through separate stab incisions on both sides and secured with 2-0 nylon.  After ensuring adequate hemostasis on both sides, the wounds were closed with a deep layer of 3-0 Vicryl and  4-0 Monocryl for the skin.  Dermabond applied to both.  All counts were found to be correct.  The patient was awoke extubated taken to recovery in satisfactory condition.

## 2020-11-04 NOTE — Anesthesia Preprocedure Evaluation (Signed)
Anesthesia Evaluation  Patient identified by MRN, date of birth, ID band Patient awake    Reviewed: Allergy & Precautions, NPO status , Patient's Chart, lab work & pertinent test results  Airway Mallampati: II  TM Distance: >3 FB Neck ROM: Full    Dental no notable dental hx.    Pulmonary sleep apnea ,    Pulmonary exam normal breath sounds clear to auscultation       Cardiovascular hypertension, Pt. on medications negative cardio ROS Normal cardiovascular exam Rhythm:Regular Rate:Normal     Neuro/Psych Anxiety negative neurological ROS  negative psych ROS   GI/Hepatic negative GI ROS, Neg liver ROS,   Endo/Other  Morbid obesity  Renal/GU   negative genitourinary   Musculoskeletal  (+) Arthritis , Osteoarthritis,    Abdominal (+) + obese,   Peds negative pediatric ROS (+)  Hematology negative hematology ROS (+)   Anesthesia Other Findings Breast Cancer  Reproductive/Obstetrics negative OB ROS                             Anesthesia Physical Anesthesia Plan  ASA: III  Anesthesia Plan: General   Post-op Pain Management:  Regional for Post-op pain   Induction: Intravenous  PONV Risk Score and Plan: 3 and Ondansetron, Dexamethasone, Midazolam and Treatment may vary due to age or medical condition  Airway Management Planned: LMA and Oral ETT  Additional Equipment:   Intra-op Plan:   Post-operative Plan: Extubation in OR  Informed Consent: I have reviewed the patients History and Physical, chart, labs and discussed the procedure including the risks, benefits and alternatives for the proposed anesthesia with the patient or authorized representative who has indicated his/her understanding and acceptance.     Dental advisory given  Plan Discussed with: CRNA  Anesthesia Plan Comments:         Anesthesia Quick Evaluation

## 2020-11-05 ENCOUNTER — Encounter (HOSPITAL_BASED_OUTPATIENT_CLINIC_OR_DEPARTMENT_OTHER): Payer: Self-pay | Admitting: Surgery

## 2020-11-05 DIAGNOSIS — D0511 Intraductal carcinoma in situ of right breast: Secondary | ICD-10-CM | POA: Diagnosis not present

## 2020-11-05 MED ORDER — ONDANSETRON 4 MG PO TBDP
ORAL_TABLET | ORAL | Status: AC
  Filled 2020-11-05: qty 1

## 2020-11-05 MED ORDER — ONDANSETRON HCL 4 MG PO TABS: 4.0000 mg | ORAL_TABLET | Freq: Every day | ORAL | 1 refills | Status: DC | PRN

## 2020-11-05 NOTE — Progress Notes (Signed)
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  Given that both have a good understanding of what to watch,  I feel they can manage her care.  I have instructed them to monitor the left side closely but it does not appear to be expanding compared to last night.  They are both comfortable with this and will call if anything changes.  Will call in am to check.  They know to return if anything changes.

## 2020-11-05 NOTE — Discharge Summary (Signed)
Physician Discharge Summary  Patient ID: Madison Wheeler MRN: [id number] DOB/AGE: 66-May-1957 66 y.o.  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  She is admitted overnight.  Overnight, she developed a small hematoma under her left mastectomy flap.  This was managed with compression and ice.  She has some ecchymoses on postop day 1 but no significant expansion of this hematoma it was quite small.  Her drain output for all 4 drains was expected.  It was slightly higher on the left.  I discussed her postop care.  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  They expressed a strong desire to go home due to family issues.  She looked hemodynamically stable and there is no significant expansion overall from the area compared to 12 hours prior.  I discussed the significance of this and care which includes compression and ice and to wear a binder.  They understand anterior drains.  I did offer admission for observation if they were comfortable with this plan.  After lengthy discussion they felt strongly about discharge but I gave him strict instructions to call if any issues and to monitor the output as well as the wound carefully.  She was tolerating her diet and could ambulate.  Her vitals were stable.  She was able to ambulate with no dizziness or difficulty with her gait go back and forth the bathroom with her IV pole by herself with no problems.  She was discharged home in stable condition.  Consults: None    Treatments: surgery: Bilateral simple mastectomies  Discharge Exam: Blood pressure (!) 128/58, pulse 85, temperature 98.6 F (37 C), resp. rate 18, height 5\' 1"  (1.549 m), weight 100 kg, SpO2 97 %. General appearance: alert and cooperative Resp: clear to auscultation bilaterally Cardio: regular rate  and rhythm, S1, S2 normal, no murmur, click, rub or gallop Incision/Wound: Left mastectomy flaps show ecchymoses.  They are viable.  There is a small hematoma that is not expanding.  JP drainage is serosanguineous to bloody but no significant clots.  Right mastectomy wound is clean dry and intact.  No significant swelling or hematoma noted.  JP drains are serosanguineous to bloody on the right.  Disposition: Discharge disposition: 01-Home or Self Care       Discharge Instructions    Diet - low sodium heart healthy   Complete by: As directed    Increase activity slowly   Complete by: As directed         Signed: Turner Daniels MD 11/05/2020, 9:32 AM

## 2020-11-05 NOTE — Progress Notes (Signed)
1 Day Post-Op   Subjective/Chief Complaint: Pt feels ok  Had some swelling that I looked at last night  She has been up walking without dizziness Some pain but better than last night  Some nausea    Objective: Vital signs in last 24 hours: Temp:  [97 F (36.1 C)-98.6 F (37 C)] 98.6 F (37 C) (03/24 0700) Pulse Rate:  [61-97] 76 (03/24 0700) Resp:  [14-22] 18 (03/24 0700) BP: (92-200)/(52-102) 92/58 (03/24 0200) SpO2:  [93 %-100 %] 97 % (03/24 0700) Weight:  [100 kg] 100 kg (03/23 1149)    Intake/Output from previous day: 03/23 0701 - 03/24 0700 In: 1390 [P.O.:240; I.V.:1100; IV Piggyback:50] Out: 0867 [Urine:750; Drains:620; Blood:50] Intake/Output this shift: No intake/output data recorded.  General appearance: alert and cooperative Resp: clear to auscultation bilaterally Cardio: regular rate and rhythm, S1, S2 normal, no murmur, click, rub or gallop Incision/Wound:left mastectomy incision intact mild swelling and probable small hematoma some bruising  JP serous/ bloody Right mastectomy incision CDI no significant swelling   Lab Results:  No results for input(s): WBC, HGB, HCT, PLT in the last 72 hours. BMET No results for input(s): NA, K, CL, CO2, GLUCOSE, BUN, CREATININE, CALCIUM in the last 72 hours. PT/INR No results for input(s): LABPROT, INR in the last 72 hours. ABG No results for input(s): PHART, HCO3 in the last 72 hours.  Invalid input(s): PCO2, PO2  Studies/Results: NM Sentinel Node Inj-No Rpt (Breast)  Result Date: 11/04/2020 Sulfur colloid was injected by the nuclear medicine technologist for melanoma sentinel node.    Anti-infectives: Anti-infectives (From admission, onward)   Start     Dose/Rate Route Frequency Ordered Stop   11/05/20 0600  ceFAZolin (ANCEF) IVPB 2g/100 mL premix        2 g 200 mL/hr over 30 Minutes Intravenous On call to O.R. 11/04/20 1649 11/06/20 0559   11/05/20 0600  clindamycin (CLEOCIN) IVPB 900 mg        900 mg 100  mL/hr over 30 Minutes Intravenous On call to O.R. 11/04/20 1649 11/06/20 0559   11/04/20 1529  ceFAZolin 1 g / gentamicin 80 mg in NS 500 mL surgical irrigation  Status:  Discontinued          As needed 11/04/20 1529 11/04/20 1606   11/04/20 1528  vancomycin (VANCOCIN) powder  Status:  Discontinued          As needed 11/04/20 1529 11/04/20 1606      Assessment/Plan: s/p Procedure(s): BILATERAL SIMPLE MASTECTOMY (Bilateral) RIGHT SENTINEL LYMPH NODE BIOPSY (Right) Move to 6N for monitoring  Given small left hematoma  No acute need foe evacuation since it is small  Check H/H as well this am and monitor     LOS: 0 days    Marcello Moores A Walida Cajas 11/05/2020

## 2020-11-05 NOTE — Anesthesia Postprocedure Evaluation (Signed)
Anesthesia Post Note  Patient: Madison Wheeler  Procedure(s) Performed: BILATERAL SIMPLE MASTECTOMY (Bilateral Breast) RIGHT SENTINEL LYMPH NODE BIOPSY (Right Axilla)     Patient location during evaluation: PACU Anesthesia Type: General Level of consciousness: sedated Pain management: pain level controlled Vital Signs Assessment: post-procedure vital signs reviewed and stable Respiratory status: spontaneous breathing and respiratory function stable Cardiovascular status: stable Postop Assessment: no apparent nausea or vomiting Anesthetic complications: yes                 Revel Stellmach DANIEL

## 2020-11-05 NOTE — Discharge Instructions (Signed)
[facility] °[phone] ° °MASTECTOMY: POST OP INSTRUCTIONS ° °Always review your discharge instruction sheet given to you by the facility where your surgery was performed. °IF YOU HAVE DISABILITY OR FAMILY LEAVE FORMS, YOU MUST BRING THEM TO THE OFFICE FOR PROCESSING.   °DO NOT GIVE THEM TO YOUR DOCTOR. °A prescription for pain medication may be given to you upon discharge.  Take your pain medication as prescribed, if needed.  If narcotic pain medicine is not needed, then you may take acetaminophen (Tylenol) or ibuprofen (Advil) as needed. °1. Take your usually prescribed medications unless otherwise directed. °2. If you need a refill on your pain medication, please contact your pharmacy.  They will contact our office to request authorization.  Prescriptions will not be filled after 5pm or on week-ends. °3. You should follow a light diet the first few days after arrival home, such as soup and crackers, etc.  Resume your normal diet the day after surgery. °4. Most patients will experience some swelling and bruising on the chest and underarm.  Ice packs will help.  Swelling and bruising can take several days to resolve.  °5. It is common to experience some constipation if taking pain medication after surgery.  Increasing fluid intake and taking a stool softener (such as Colace) will usually help or prevent this problem from occurring.  A mild laxative (Milk of Magnesia or Miralax) should be taken according to package instructions if there are no bowel movements after 48 hours. °6. Unless discharge instructions indicate otherwise, leave your bandage dry and in place until your next appointment in 3-5 days.  You may take a limited sponge bath.  No tube baths or showers until the drains are removed.  You may have steri-strips (small skin tapes) in place directly over the incision.  These strips should be left on the skin for 7-10 days.  If your surgeon used skin glue on the incision, you may  shower in 24 hours.  The glue will flake off over the next 2-3 weeks.  Any sutures or staples will be removed at the office during your follow-up visit. °7. DRAINS:  If you have drains in place, it is important to keep a list of the amount of drainage produced each day in your drains.  Before leaving the hospital, you should be instructed on drain care.  Call our office if you have any questions about your drains. °8. ACTIVITIES:  You may resume regular (light) daily activities beginning the next day--such as daily self-care, walking, climbing stairs--gradually increasing activities as tolerated.  You may have sexual intercourse when it is comfortable.  Refrain from any heavy lifting or straining until approved by your doctor. °a. You may drive when you are no longer taking prescription pain medication, you can comfortably wear a seatbelt, and you can safely maneuver your car and apply brakes. °b. RETURN TO WORK:  __________________________________________________________ °9. You should see your doctor in the office for a follow-up appointment approximately 3-5 days after your surgery.  Your doctor’s nurse will typically make your follow-up appointment when she calls you with your pathology report.  Expect your pathology report 2-3 business days after your surgery.  You may call to check if you do not hear from us after three days.   °10. OTHER INSTRUCTIONS: ______________________________________________________________________________________________ ____________________________________________________________________________________________ ° °WHEN TO CALL YOUR DOCTOR: °1. Fever over 101.0 °2. Nausea and/or vomiting °3. Extreme swelling or bruising °4. Continued bleeding from incision. °5. Increased pain, redness, or drainage from the   incision. ° °The clinic staff is available to answer your questions during regular business hours.  Please don’t hesitate to call and ask to speak to one of the nurses for clinical  concerns.  If you have a medical emergency, go to the nearest emergency room or call 911.  A surgeon from [facility] is always on call at the hospital. °[address], [address], [place], [place]  [address] ? [address] [address], [place],    [address] °[phone] ? [phone] ? FAX [phone] °Web site: [facility] ° °About my Jackson-Pratt Bulb Drain ° °What is a Jackson-Pratt bulb? °A Jackson-Pratt is a soft, round device used to collect drainage. It is connected to a long, thin drainage catheter, which is held in place by one or two small stiches near your surgical incision site. When the bulb is squeezed, it forms a vacuum, forcing the drainage to empty into the bulb. ° °Emptying the Jackson-Pratt bulb- °To empty the bulb: °1. Release the plug on the top of the bulb. °2. Pour the bulb's contents into a measuring container which your nurse will provide. °3. Record the time emptied and amount of drainage. Empty the drain(s) as often as your     doctor or nurse recommends. ° °Date                  Time                    Amount (Drain 1)                 Amount (Drain 2) ° °_____________________________________________________________________ ° °_____________________________________________________________________ ° °_____________________________________________________________________ ° °_____________________________________________________________________ ° °_____________________________________________________________________ ° °_____________________________________________________________________ ° °_____________________________________________________________________ ° °_____________________________________________________________________ ° °Squeezing the Jackson-Pratt Bulb- °To squeeze the bulb: °1. Make sure the plug at the top of the bulb is open. °2. Squeeze the bulb tightly in your fist. You will hear air squeezing from the bulb. °3. Replace the plug while the bulb is squeezed. °4.  Use a safety pin to attach the bulb to your clothing. This will keep the catheter from     pulling at the bulb insertion site. ° °When to call your doctor- °Call your doctor if: °· Drain site becomes red, swollen or hot. °· You have a fever greater than 101 degrees F. °· There is oozing at the drain site. °· Drain falls out (apply a guaze bandage over the drain hole and secure it with tape). °· Drainage increases daily not related to activity patterns. (You will usually have more drainage when you are active than when you are resting.) °· Drainage has a bad odor. ° °

## 2020-11-06 LAB — SURGICAL PATHOLOGY

## 2020-11-09 ENCOUNTER — Encounter: Payer: Self-pay | Admitting: *Deleted

## 2020-11-10 NOTE — Progress Notes (Signed)
Patient Care Team: Raina Mina., MD as PCP - General (Internal Medicine) Mauro Kaufmann, RN as Oncology Nurse Navigator Rockwell Germany, RN as Oncology Nurse Navigator  DIAGNOSIS:    ICD-10-CM   1. Ductal carcinoma in situ (DCIS) of right breast  D05.11     SUMMARY OF ONCOLOGIC HISTORY: Oncology History  Ductal carcinoma in situ (DCIS) of right breast  10/13/2020 Initial Diagnosis   Screening mammogram showed right breast calcifications. Diagnostic mammogram showed right breast calcifications spanning 0.9cm and enlarged left axillary lymph nodes. 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      CHIEF COMPLIANT: Follow-up s/p bilateral mastectomies  INTERVAL HISTORY: Madison Wheeler is a 65 y.o. with above-mentioned history of right breast DCIS. She underwent bilateral mastectomies on 11/04/20 with Dr Brantley Stage for which pathology showed in the right breast, high grade DCIS, 0.8cm clear margins, and in the left breast, fibrocystic changes and no evidence of carcinoma, with one right axillary lymph node negative for carcinoma. She presents to the clinic today to review the pathology report and discuss further treatment.  Her major complaint is related to the drain site which she is  uncomfortable with.  She is still getting over 300 mL of fluid daily.  ALLERGIES:  has No Known Allergies.  MEDICATIONS:  Current Outpatient Medications  Medication Sig Dispense Refill  . Ascorbic Acid (VITAMIN C PO) Take 1,000 mg by mouth daily.    Marland Kitchen atorvastatin (LIPITOR) 10 MG tablet Take 10 mg by mouth daily.    . Calcium Carb-Cholecalciferol [phone] MG-UNIT CAPS Take by mouth.    . cloNIDine (CATAPRES) 0.2 MG tablet Take 0.2 mg by mouth 2 (two) times daily.    Marland Kitchen ibuprofen (ADVIL) 800 MG tablet Take 1 tablet (800 mg total) by mouth every 8 (eight) hours as needed. 30 tablet 0  . ondansetron (ZOFRAN) 4 MG tablet Take 1 tablet (4 mg total) by mouth daily as needed for nausea or vomiting. 30 tablet 1  . oxyCODONE (OXY IR/ROXICODONE) 5 MG immediate release tablet Take 1 tablet (5 mg total) by mouth every 6 (six) hours as needed for severe pain. 15 tablet 0   No current facility-administered medications for this visit.    PHYSICAL EXAMINATION: ECOG PERFORMANCE STATUS: 1 - Symptomatic but completely ambulatory  Vitals:   11/11/20 1002  BP: (!) 135/47  Pulse: 87  Resp: 18  Temp: (!) 97.5 F (36.4 C)  SpO2: 99%   Filed Weights   11/11/20 1002  Weight: 224 lb (101.6 kg)    LABORATORY DATA:  I have reviewed the data as listed CMP Latest Ref Rng & Units 10/21/2020  Glucose 70 - 99 mg/dL 101(H)  BUN 8 - 23 mg/dL 11  Creatinine 0.44 - 1.00 mg/dL 0.72  Sodium 135 - 145 mmol/L 140  Potassium 3.5 - 5.1  mmol/L 4.0  Chloride 98 - 111 mmol/L 107  CO2 22 - 32 mmol/L 23  Calcium 8.9 - 10.3 mg/dL 9.3  Total Protein 6.5 - 8.1 g/dL 7.4  Total Bilirubin 0.3 - 1.2 mg/dL 0.5  Alkaline Phos 38 - 126 U/L 137(H)  AST 15 - 41 U/L 19  ALT 0 - 44 U/L 25    Lab Results  Component Value Date   WBC 8.0 10/21/2020   HGB 13.8 10/21/2020   HCT 40.8 10/21/2020   MCV 80.6 10/21/2020   PLT 315 10/21/2020   NEUTROABS 5.6 10/21/2020    ASSESSMENT & PLAN:  Ductal carcinoma in situ (DCIS) of  right breast 10/13/2020:Screening mammogram showed right breast calcifications. Diagnostic mammogram showed right breast calcifications spanning 0.9cm and enlarged left axillary lymph nodes. 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   Pathology counseling: I discussed the final pathology report of the patient provided  a copy of this report. I discussed the margins as well as lymph node surgeries. We also discussed the final staging along with previously performed ER/PR  testing.  Drain tubes: Still she is getting over 300 mL of fluid in the drains.  Plan: No role of Adjuvant therapy since she underwent Bil mastectomies RTC as needed     No orders of the defined types were placed in this encounter.  The patient has a good understanding of the overall plan. she agrees with it. she will call with any problems that may develop before the next visit here.  Total time spent: 20 mins including face to face time and time spent for planning, charting and coordination of care  Rulon Eisenmenger, MD, MPH 11/11/2020  I, Molly Dorshimer, am acting as scribe for Dr. Nicholas Lose.  I have reviewed the above documentation for accuracy and completeness, and I agree with the above.

## 2020-11-10 NOTE — Assessment & Plan Note (Signed)
10/13/2020:Screening mammogram showed right breast calcifications. Diagnostic mammogram showed right breast calcifications spanning 0.9cm and enlarged left axillary lymph nodes. 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   Pathology counseling: I discussed the final pathology report of the patient provided  a copy of this report. I discussed the margins as well as lymph node surgeries. We also discussed the final staging along with previously performed ER/PR  testing.  Plan: No role of Adjuvant therapy since she underwent Bil mastectomies RTC as needed

## 2020-11-11 ENCOUNTER — Other Ambulatory Visit: Payer: Self-pay

## 2020-11-11 ENCOUNTER — Inpatient Hospital Stay (HOSPITAL_BASED_OUTPATIENT_CLINIC_OR_DEPARTMENT_OTHER): Payer: Medicare Other | Admitting: Hematology and Oncology

## 2020-11-11 ENCOUNTER — Encounter: Payer: Self-pay | Admitting: *Deleted

## 2020-11-11 DIAGNOSIS — D0511 Intraductal carcinoma in situ of right breast: Secondary | ICD-10-CM

## 2020-11-13 ENCOUNTER — Telehealth: Payer: Self-pay | Admitting: Genetic Counselor

## 2020-11-13 ENCOUNTER — Encounter: Payer: Self-pay | Admitting: Genetic Counselor

## 2020-11-13 ENCOUNTER — Ambulatory Visit: Payer: Self-pay | Admitting: Genetic Counselor

## 2020-11-13 DIAGNOSIS — Z1379 Encounter for other screening for genetic and chromosomal anomalies: Secondary | ICD-10-CM | POA: Insufficient documentation

## 2020-11-13 DIAGNOSIS — Z801 Family history of malignant neoplasm of trachea, bronchus and lung: Secondary | ICD-10-CM

## 2020-11-13 DIAGNOSIS — Z803 Family history of malignant neoplasm of breast: Secondary | ICD-10-CM

## 2020-11-13 DIAGNOSIS — D0511 Intraductal carcinoma in situ of right breast: Secondary | ICD-10-CM

## 2020-11-13 DIAGNOSIS — Z808 Family history of malignant neoplasm of other organs or systems: Secondary | ICD-10-CM

## 2020-11-13 NOTE — Telephone Encounter (Signed)
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  Discussed that we do not know why she has breast cancer or why there is cancer in the family. It could be due to a different gene that we are not testing, or maybe our current technology may not be able to pick something up.  It will be important for her to keep in contact with genetics to keep up with whether additional testing may be needed.

## 2020-11-15 NOTE — Progress Notes (Signed)
HPI:  Ms. Ebert was previously seen in the [facility] due to a personal and family history of cancer and concerns regarding a hereditary predisposition to cancer. Please refer to our prior cancer genetics clinic note for more information regarding our discussion, assessment and recommendations, at the time. Ms. Chiusano recent genetic test results were disclosed to her, as were recommendations warranted by these results. These results and recommendations are discussed in more detail below.  CANCER HISTORY:  Oncology History  Ductal carcinoma in situ (DCIS) of right breast  10/13/2020 Initial Diagnosis   Screening mammogram showed right breast calcifications. Diagnostic mammogram showed right breast calcifications spanning 0.9cm and enlarged left axillary lymph nodes. 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    11/11/2020 Genetic Testing   Negative hereditary cancer genetic testing: no pathogenic variants detected in [facility] CancerNext-Expanded +RNAinsight Panel.  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 The report date is November 11, 2020.   The CancerNext-Expanded gene panel offered by [facility] and includes sequencing, rearrangement,  and RNA analysis for the following 77 genes: AIP, ALK, APC, ATM, AXIN2, BAP1, BARD1, BLM, BMPR1A, BRCA1, BRCA2, BRIP1, CDC73, CDH1, CDK4, CDKN1B, CDKN2A, CHEK2, CTNNA1, DICER1, FANCC, FH, FLCN, GALNT12, KIF1B, LZTR1, MAX, MEN1, MET, MLH1, MSH2, MSH3, MSH6, MUTYH, NBN, NF1, NF2, NTHL1, PALB2, PHOX2B, PMS2, POT1, PRKAR1A, PTCH1, PTEN, RAD51C, RAD51D, RB1, RECQL, RET, SDHA, SDHAF2, SDHB, SDHC, SDHD, SMAD4, SMARCA4, SMARCB1, SMARCE1, STK11, SUFU, TMEM127, TP53, TSC1, TSC2, VHL and XRCC2 (sequencing and deletion/duplication); EGFR, EGLN1, HOXB13, KIT, MITF, PDGFRA, POLD1, and POLE (sequencing only); EPCAM and GREM1 (deletion/duplication only).      FAMILY HISTORY:  We obtained a detailed, 4-generation family history.  Significant diagnoses are listed below: Family History  Problem Relation Age of Onset  . Breast cancer Sister 35  . Breast cancer Maternal Aunt 64  . Breast cancer Paternal Grandmother 60  . Breast cancer Paternal Aunt 55  . Breast cancer Sister 36       identical twin sister  . Basal cell carcinoma Sister   . Lung cancer Paternal Uncle        dx late 36s  . Breast cancer Cousin        dx 3s; maternal cousin  . Breast cancer Cousin        early 22s; paternal cousin  . Breast cancer Cousin 36       paternal cousin  . Breast cancer Maternal Aunt 60  . Breast cancer Maternal Aunt 58  . Melanoma Father   . Colon cancer Other 35       maternal aunt's granddaughter   . Breast cancer Cousin        dx 49s; maternal cousin  . Basal cell carcinoma Nephew        onset before age 66  Ms. Stallings has one son and one daughter, both in their 70s.  Ms. Lapenna has an identical twin sister with a history of breast cancer at age 20 and a history of basal cell carcinoma.  Her genetics were reportedly negative two years ago.  Ms. Ramone other sister was diagnosed with breast cancer at age 77 and passed away at age 37.    Ms. Fulco mother died at age 56 and did not have cancer.  Three  of Ms. Natarajan maternal aunts had breast cancer (mid 50s-early 21s).  Two of Ms. Mainwaring maternal cousins had breast cancer (dx 77s, dx 21s).    Ms. Buras father (d. 52) had a history of melanoma, which was surgically removed.  Ms. Jaggers had a paternal aunt (dx 17), two paternal cousins (dx 57s, dx 56), and paternal grandmother (dx 87) with breast cancer.   Ms. Siebel is unaware of previous family history of genetic testing for hereditary cancer risks besides that mentioned above. Patient's maternal ancestors are of White/Caucasian descent, and paternal ancestors are of White/Caucasian descent. There is no reported Ashkenazi Jewish ancestry. There is no known consanguinity.  GENETIC TEST RESULTS: Genetic testing reported out on November 11, 2020.  The [facility] CancerNext-Expanded +RNAinsight Panel found no pathogenic mutations. The CancerNext-Expanded gene panel offered by [facility] and includes sequencing, rearrangement, and RNA analysis for the following 77 genes: AIP, ALK, APC, ATM, AXIN2, BAP1, BARD1, BLM, BMPR1A, BRCA1, BRCA2, BRIP1, CDC73, CDH1, CDK4, CDKN1B, CDKN2A, CHEK2, CTNNA1, DICER1, FANCC, FH, FLCN, GALNT12, KIF1B, LZTR1, MAX, MEN1, MET, MLH1, MSH2, MSH3, MSH6, MUTYH, NBN, NF1, NF2, NTHL1, PALB2, PHOX2B, PMS2, POT1, PRKAR1A, PTCH1, PTEN, RAD51C, RAD51D, RB1, RECQL, RET, SDHA, SDHAF2, SDHB, SDHC, SDHD, SMAD4, SMARCA4, SMARCB1, SMARCE1, STK11, SUFU, TMEM127, TP53, TSC1, TSC2, VHL and XRCC2 (sequencing and deletion/duplication); EGFR, EGLN1, HOXB13, KIT, MITF, PDGFRA, POLD1, and POLE (sequencing only); EPCAM and GREM1 (deletion/duplication only).   The test report has been scanned into EPIC and is located under the Molecular Pathology section of the Results Review tab.  A portion of the result report is included below for reference.     We discussed with Ms. Pates that because current genetic testing is not perfect, it is possible there may be a gene mutation in one of these genes  that current testing cannot detect, but that chance is small.  We also discussed, that there could be another gene that has not yet been discovered, or that we have not yet tested, that is responsible for the cancer diagnoses in the family. It is also possible there is a hereditary cause for the cancer in the family that Ms. Garrow did not inherit and therefore was not identified in her testing.  Therefore, it is important to remain in touch with cancer genetics in the future so that we can continue to offer Ms. Canevari the most up to date genetic testing.   Genetic testing did identify a variant of uncertain significance (VUS) was identified in the NF1 gene called  p.Q1370P (c.4109A>C).  At this time, it is unknown if this variant is associated with increased cancer risk or if this is a normal finding, but most variants such as this get reclassified to being inconsequential. It should not be used to make medical management decisions. With time, we suspect the lab will determine the significance of this variant, if any. If we do learn more about it, we will try to contact Ms. Todaro to discuss it further. However, it is important to stay in  touch with [place] periodically and keep the address and phone number up to date.  ADDITIONAL GENETIC TESTING: We discussed with Ms. Gilberti that her genetic testing was fairly extensive.  If there are genes identified to increase cancer risk that can be analyzed in the future, we would be happy to discuss and coordinate this testing at that time.    CANCER SCREENING RECOMMENDATIONS: Ms. Mick test result is considered negative (normal).  This means that we have not identified a hereditary cause for her personal and family history of cancer at this time.   Given Ms. Lewis personal and family histories, we must interpret these negative results with some caution.  Families with features suggestive of hereditary risk for cancer tend to have multiple family members with  cancer, diagnoses in multiple generations and diagnoses before the age of 57. Ms. Reeg family exhibits some of these features. Thus, this result may simply reflect our current inability to detect all mutations within these genes or there may be a different gene that has not yet been discovered or tested.   An individual's cancer risk and medical management are not determined by genetic test results alone. Overall cancer risk assessment incorporates additional factors, including personal medical history, family history, and any available genetic information that may result in a personalized plan for cancer prevention and surveillance.  RECOMMENDATIONS FOR FAMILY MEMBERS:  Individuals in this family might be at some increased risk of developing cancer, over the general population risk, simply due to the family history of cancer.  We recommended women in this family have a yearly mammogram beginning at age 40, or 35 years younger than the earliest onset of cancer, an annual clinical breast exam, and perform monthly breast self-exams. Women in this family should also have a gynecological exam as recommended by their primary provider. Family members should be referred for colonoscopy starting at age 49 or sooner, as recommended by their physicians.   It is also possible there is a hereditary cause for the cancer in Ms. Phillipson family that she did not inherit and therefore was not identified in her.  Based on Ms. Tanney family history, we recommended her family members with a history of breast cancer have genetic counseling and testing if they have not had updated testing already. Ms. Bartl will let us know if we can be of any assistance in coordinating genetic counseling and/or testing for this family member.   FOLLOW-UP: Lastly, we discussed with Ms. Kindel that cancer genetics is a rapidly advancing field and it is possible that new genetic tests will be appropriate for her and/or her family members in  the future. We encouraged her to remain in contact with cancer genetics on an annual basis so we can update her personal and family histories and let her know of advances in cancer genetics that may benefit this family.   Our contact number was provided. Ms. Guerrieri questions were answered to her satisfaction, and she knows she is welcome to call us at anytime with additional questions or concerns.    Yerania Chamorro M. Joette Catching, [place], [facility] Genetic Counselor [email or web address] (P) [phone]

## 2020-11-20 ENCOUNTER — Emergency Department (HOSPITAL_COMMUNITY): Payer: Medicare Other

## 2020-11-20 ENCOUNTER — Emergency Department (HOSPITAL_COMMUNITY)
Admission: EM | Admit: 2020-11-20 | Discharge: 2020-11-20 | Disposition: A | Discharge status: Home / Self Care | Payer: Medicare Other | Attending: Emergency Medicine | Admitting: Emergency Medicine

## 2020-11-20 ENCOUNTER — Other Ambulatory Visit: Payer: Self-pay

## 2020-11-20 DIAGNOSIS — Z853 Personal history of malignant neoplasm of breast: Secondary | ICD-10-CM | POA: Diagnosis not present

## 2020-11-20 DIAGNOSIS — R000 Tachycardia, unspecified: Secondary | ICD-10-CM | POA: Diagnosis not present

## 2020-11-20 DIAGNOSIS — L089 Local infection of the skin and subcutaneous tissue, unspecified: Secondary | ICD-10-CM | POA: Insufficient documentation

## 2020-11-20 DIAGNOSIS — S299XXA Unspecified injury of thorax, initial encounter: Secondary | ICD-10-CM | POA: Diagnosis present

## 2020-11-20 DIAGNOSIS — R600 Localized edema: Secondary | ICD-10-CM | POA: Insufficient documentation

## 2020-11-20 DIAGNOSIS — Z79899 Other long term (current) drug therapy: Secondary | ICD-10-CM | POA: Insufficient documentation

## 2020-11-20 DIAGNOSIS — R0602 Shortness of breath: Secondary | ICD-10-CM

## 2020-11-20 DIAGNOSIS — S20212A Contusion of left front wall of thorax, initial encounter: Secondary | ICD-10-CM | POA: Insufficient documentation

## 2020-11-20 DIAGNOSIS — Z20822 Contact with and (suspected) exposure to covid-19: Secondary | ICD-10-CM | POA: Insufficient documentation

## 2020-11-20 DIAGNOSIS — X58XXXA Exposure to other specified factors, initial encounter: Secondary | ICD-10-CM | POA: Diagnosis not present

## 2020-11-20 DIAGNOSIS — I10 Essential (primary) hypertension: Secondary | ICD-10-CM | POA: Diagnosis not present

## 2020-11-20 LAB — CBC WITH DIFFERENTIAL/PLATELET
Abs Immature Granulocytes: 0.03 10*3/uL (ref 0.00–0.07)
Basophils Absolute: 0 10*3/uL (ref 0.0–0.1)
Basophils Relative: 1 %
Eosinophils Absolute: 0.3 10*3/uL (ref 0.0–0.5)
Eosinophils Relative: 5 %
HCT: 30.4 % — ABNORMAL LOW (ref 36.0–46.0)
Hemoglobin: 9.7 g/dL — ABNORMAL LOW (ref 12.0–15.0)
Immature Granulocytes: 1 %
Lymphocytes Relative: 19 %
Lymphs Abs: 1.2 10*3/uL (ref 0.7–4.0)
MCH: 27.7 pg (ref 26.0–34.0)
MCHC: 31.9 g/dL (ref 30.0–36.0)
MCV: 86.9 fL (ref 80.0–100.0)
Monocytes Absolute: 0.5 10*3/uL (ref 0.1–1.0)
Monocytes Relative: 8 %
Neutro Abs: 4.2 10*3/uL (ref 1.7–7.7)
Neutrophils Relative %: 66 %
Platelets: 400 10*3/uL (ref 150–400)
RBC: 3.5 MIL/uL — ABNORMAL LOW (ref 3.87–5.11)
RDW: 14.4 % (ref 11.5–15.5)
WBC: 6.3 10*3/uL (ref 4.0–10.5)
nRBC: 0 % (ref 0.0–0.2)

## 2020-11-20 LAB — URINALYSIS, ROUTINE W REFLEX MICROSCOPIC
Bilirubin Urine: NEGATIVE
Glucose, UA: NEGATIVE mg/dL
Hgb urine dipstick: NEGATIVE
Ketones, ur: NEGATIVE mg/dL
Leukocytes,Ua: NEGATIVE
Nitrite: NEGATIVE
Protein, ur: NEGATIVE mg/dL
Specific Gravity, Urine: 1.014 (ref 1.005–1.030)
pH: 5 (ref 5.0–8.0)

## 2020-11-20 LAB — COMPREHENSIVE METABOLIC PANEL
ALT: 20 U/L (ref 0–44)
AST: 21 U/L (ref 15–41)
Albumin: 3.3 g/dL — ABNORMAL LOW (ref 3.5–5.0)
Alkaline Phosphatase: 118 U/L (ref 38–126)
Anion gap: 9 (ref 5–15)
BUN: 9 mg/dL (ref 8–23)
CO2: 23 mmol/L (ref 22–32)
Calcium: 8.8 mg/dL — ABNORMAL LOW (ref 8.9–10.3)
Chloride: 108 mmol/L (ref 98–111)
Creatinine, Ser: 0.72 mg/dL (ref 0.44–1.00)
GFR, Estimated: >60 mL/min (ref >60)
Glucose, Bld: 131 mg/dL — ABNORMAL HIGH (ref 70–99)
Potassium: 3.1 mmol/L — ABNORMAL LOW (ref 3.5–5.1)
Sodium: 140 mmol/L (ref 135–145)
Total Bilirubin: 0.4 mg/dL (ref 0.3–1.2)
Total Protein: 6 g/dL — ABNORMAL LOW (ref 6.5–8.1)

## 2020-11-20 LAB — TROPONIN I (HIGH SENSITIVITY): Troponin I (High Sensitivity): 4 ng/L (ref <18)

## 2020-11-20 LAB — LACTIC ACID, PLASMA: Lactic Acid, Venous: 1 mmol/L (ref 0.5–1.9)

## 2020-11-20 LAB — BRAIN NATRIURETIC PEPTIDE: B Natriuretic Peptide: 107.4 pg/mL — ABNORMAL HIGH (ref 0.0–100.0)

## 2020-11-20 LAB — D-DIMER, QUANTITATIVE: D-Dimer, Quant: 0.85 ug/mL-FEU — ABNORMAL HIGH (ref 0.00–0.50)

## 2020-11-20 LAB — POC SARS CORONAVIRUS 2 AG -  ED: SARS Coronavirus 2 Ag: NEGATIVE

## 2020-11-20 MED ORDER — VANCOMYCIN HCL 2000 MG/400ML IV SOLN
2000.0000 mg | Freq: Once | INTRAVENOUS | Status: AC
  Administered 2020-11-20: 2000 mg via INTRAVENOUS
  Filled 2020-11-20: qty 400

## 2020-11-20 MED ORDER — SODIUM CHLORIDE 0.9 % IV BOLUS
500.0000 mL | Freq: Once | INTRAVENOUS | Status: AC
  Administered 2020-11-20: 500 mL via INTRAVENOUS

## 2020-11-20 MED ORDER — LIDOCAINE 1% INJECTION FOR CIRCUMCISION
30.0000 mL | INJECTION | Freq: Once | INTRAVENOUS | Status: DC
  Filled 2020-11-20: qty 30

## 2020-11-20 MED ORDER — IOHEXOL 350 MG/ML SOLN
60.0000 mL | Freq: Once | INTRAVENOUS | Status: AC | PRN
  Administered 2020-11-20: 60 mL via INTRAVENOUS

## 2020-11-20 MED ORDER — LIDOCAINE HCL (PF) 1 % IJ SOLN
30.0000 mL | Freq: Once | INTRAMUSCULAR | Status: AC
  Administered 2020-11-20: 30 mL via INTRADERMAL
  Filled 2020-11-20: qty 30

## 2020-11-20 MED ORDER — IBUPROFEN 800 MG PO TABS
800.0000 mg | ORAL_TABLET | Freq: Once | ORAL | Status: AC
Start: 2020-11-20 — End: 2020-11-20
  Administered 2020-11-20: 800 mg via ORAL
  Filled 2020-11-20: qty 1

## 2020-11-20 MED ORDER — HYDROMORPHONE HCL 1 MG/ML IJ SOLN
0.5000 mg | Freq: Once | INTRAMUSCULAR | Status: AC
  Administered 2020-11-20: 0.5 mg via INTRAVENOUS
  Filled 2020-11-20: qty 1

## 2020-11-20 NOTE — ED Provider Notes (Signed)
[place] EMERGENCY DEPARTMENT Provider Note   CSN: [id number] Arrival date & time: 11/20/20  1353     History Chief Complaint  Patient presents with  . Wound Check    Madison Wheeler is a 65 y.o. female.  Madison Wheeler, bilateral mastectomy on November 04, 2020.  Her left chest wall has continued to drain large amounts of bloody fluid, and she is easily emptying over 200 cc/day.  The wound has started to become painful and open with some purulent drainage.  She has had a daily fever for several days with a T-max of 104.  She has been so fatigued and tired that she has not been able to eat.  The history is provided by the patient.  Fever Max temp prior to arrival:  104 Temp source:  Oral Severity:  Severe Onset quality:  Gradual Duration:  5 days Timing:  Constant Progression:  Worsening Chronicity:  New Relieved by:  Nothing Worsened by:  Nothing Ineffective treatments:  None tried Associated symptoms: somnolence   Associated symptoms: no chest pain, no chills, no confusion, no congestion, no cough, no diarrhea, no dysuria, no ear pain, no myalgias, no rash, no rhinorrhea, no sore throat and no vomiting        Past Medical History:  Diagnosis Date  . Acute bronchitis   . Anxiety   . Arthritis   . Calculus of kidney   . Cancer [facility]) 09/2020   right breast DCIS  . Family history of breast cancer 10/22/2020  . Family history of lung cancer 10/22/2020  . Family history of malignant neoplasm of breast   . Family history of skin cancer 10/22/2020  . Hyperlipidemia   . Hypertension   . Obesity   . OSA (obstructive sleep apnea)    uses CPAP nightly  . Rosacea   . Unspecified otitis media   . Vitamin D deficiency     Patient Active Problem List   Diagnosis Date Noted  . Genetic testing 11/13/2020  . Family history of breast cancer 10/22/2020  . Family history of skin cancer 10/22/2020  . Family history of lung cancer 10/22/2020  . 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 Cough 08/18/2011    Past Surgical History:  Procedure Laterality Date  . ABDOMINAL HYSTERECTOMY    . APPENDECTOMY    . CESAREAN SECTION    . CHOLECYSTECTOMY  2005  . SENTINEL NODE BIOPSY Right 11/04/2020   Procedure: RIGHT SENTINEL LYMPH NODE BIOPSY;  Surgeon: Erroll Luna, MD;  Location: [facility];  Service: General;  Laterality: Right;  . SIMPLE MASTECTOMY WITH AXILLARY SENTINEL NODE BIOPSY Bilateral 11/04/2020   Procedure: BILATERAL SIMPLE MASTECTOMY;  Surgeon: Erroll Luna, MD;  Location: [facility];  Service: General;  Laterality: Bilateral;  . TUBAL LIGATION    . VESICOVAGINAL FISTULA CLOSURE W/ TAH  1986     OB History   No obstetric history on file.     Family History  Problem Relation Age of Onset  . Breast cancer Sister 74  . Breast cancer Maternal Aunt 101  . Breast cancer Paternal Grandmother 83  . Breast cancer Paternal Aunt 93  . Breast cancer Sister 9       identical twin sister  . Basal cell carcinoma Sister   . Lung cancer Paternal Uncle        dx late 49s  . Breast cancer Cousin        dx  58s; maternal cousin  . Breast cancer Cousin        early 83s; paternal cousin  . Breast cancer Cousin 71       paternal cousin  . Breast cancer Maternal Aunt 60  . Breast cancer Maternal Aunt 58  . Melanoma Father   . Colon cancer Other 35       maternal aunt's granddaughter   . Breast cancer Cousin        dx 31s; maternal cousin  . Basal cell carcinoma Nephew        onset before age 81    Social History   Tobacco Use  . Smoking status: Never Smoker  . Smokeless tobacco: Never Used  Substance Use Topics  . Alcohol use: No  . Drug use: Never    Home Medications Prior to Admission medications   Medication Sig Start Date End Date Taking? Authorizing Provider  Ascorbic Acid (VITAMIN C PO) Take 1,000 mg by mouth daily.    [provider]  atorvastatin (LIPITOR) 10 MG  tablet Take 10 mg by mouth daily.    [provider]  Calcium Carb-Cholecalciferol [id number] MG-UNIT CAPS Take by mouth.    [provider]  cloNIDine (CATAPRES) 0.2 MG tablet Take 0.2 mg by mouth 2 (two) times daily.    [provider]  ibuprofen (ADVIL) 800 MG tablet Take 1 tablet (800 mg total) by mouth every 8 (eight) hours as needed. 11/04/20   Cornett, Marcello Moores, MD  ondansetron (ZOFRAN) 4 MG tablet Take 1 tablet (4 mg total) by mouth daily as needed for nausea or vomiting. 11/05/20 11/05/21  Cornett, Marcello Moores, MD  oxyCODONE (OXY IR/ROXICODONE) 5 MG immediate release tablet Take 1 tablet (5 mg total) by mouth every 6 (six) hours as needed for severe pain. 11/04/20   Erroll Luna, MD    Allergies    Patient has no known allergies.  Review of Systems   Review of Systems  Constitutional: Positive for fever. Negative for chills.  HENT: Negative for congestion, ear pain, rhinorrhea and sore throat.   Eyes: Negative for pain and visual disturbance.  Respiratory: Negative for cough and shortness of breath.   Cardiovascular: Negative for chest pain and palpitations.  Gastrointestinal: Negative for abdominal pain, diarrhea and vomiting.  Genitourinary: Negative for dysuria and hematuria.  Musculoskeletal: Negative for arthralgias, back pain and myalgias.  Skin: Negative for color change and rash.  Neurological: Negative for seizures and syncope.  Psychiatric/Behavioral: Negative for confusion.  All other systems reviewed and are negative.   Physical Exam Updated Vital Signs BP (!) 168/76   Pulse (!) 104   Temp 99.3 F (37.4 C) (Oral)   Resp 19   SpO2 100%   Physical Exam Vitals and nursing note reviewed.  Constitutional:      General: She is not in acute distress.    Appearance: She is well-developed.  HENT:     Head: Normocephalic and atraumatic.  Eyes:     Conjunctiva/sclera: Conjunctivae normal.  Cardiovascular:     Rate and Rhythm: Regular rhythm.  Tachycardia present.     Heart sounds: No murmur heard.   Pulmonary:     Effort: Pulmonary effort is normal. No respiratory distress.     Breath sounds: Normal breath sounds.  Abdominal:     Palpations: Abdomen is soft.     Tenderness: There is no abdominal tenderness.  Musculoskeletal:        General: Swelling present.     Cervical back: Neck  supple.     Right lower leg: Edema present.     Left lower leg: Edema present.  Skin:    General: Skin is warm and dry.     Comments: The right chest wall incision is clean, dry, and intact.  It is well-healed.  No signs or symptoms of cellulitis.  The left side incision has dehisced in almost its entirety but especially at the medial aspect.  It is slightly erythematous and especially tender around the medial aspect of the wound.  There is thick purulent drainage from the site as well.  Neurological:     General: No focal deficit present.     Mental Status: She is alert.  Psychiatric:        Mood and Affect: Mood normal.     ED Results / Procedures / Treatments   Labs (all labs ordered are listed, but only abnormal results are displayed) Labs Reviewed  CBC WITH DIFFERENTIAL/PLATELET - Abnormal; Notable for the following components:      Result Value   RBC 3.50 (*)    Hemoglobin 9.7 (*)    HCT 30.4 (*)    All other components within normal limits  COMPREHENSIVE METABOLIC PANEL - Abnormal; Notable for the following components:   Potassium 3.1 (*)    Glucose, Bld 131 (*)    Calcium 8.8 (*)    Total Protein 6.0 (*)    Albumin 3.3 (*)    All other components within normal limits  BRAIN NATRIURETIC PEPTIDE - Abnormal; Notable for the following components:   B Natriuretic Peptide 107.4 (*)    All other components within normal limits  D-DIMER, QUANTITATIVE (NOT AT [facility]) - Abnormal; Notable for the following components:   D-Dimer, Quant 0.85 (*)    All other components within normal limits  CULTURE, BLOOD (ROUTINE X 2)  CULTURE, BLOOD  (ROUTINE X 2)  LACTIC ACID, PLASMA  URINALYSIS, ROUTINE W REFLEX MICROSCOPIC  LACTIC ACID, PLASMA  POC SARS CORONAVIRUS 2 AG -  ED  TROPONIN I (HIGH SENSITIVITY)  TROPONIN I (HIGH SENSITIVITY)    EKG None  Radiology DG Chest 2 View  Result Date: 11/20/2020 CLINICAL DATA:  Shortness of breath.  Recent double mastectomy. EXAM: CHEST - 2 VIEW COMPARISON:  Radiograph 08/18/2011 FINDINGS: The cardiomediastinal contours are normal. The lungs are clear. Pulmonary vasculature is normal. No consolidation, pleural effusion, or pneumothorax. No acute osseous abnormalities are seen. Surgical drains overlie the anterior chest wall. Surgical clips in both axilla. IMPRESSION: 1. No acute intrathoracic finding. 2. Surgical drains overlie the anterior chest wall from recent mastectomy. Electronically Signed   By: Keith Rake M.D.   On: 11/20/2020 15:22   CT Angio Chest PE W/Cm &/Or Wo Cm  Result Date: 11/20/2020 CLINICAL DATA:  Shortness of breath with exertion EXAM: CT ANGIOGRAPHY CHEST WITH CONTRAST TECHNIQUE: Multidetector CT imaging of the chest was performed using the standard protocol during bolus administration of intravenous contrast. Multiplanar CT image reconstructions and MIPs were obtained to evaluate the vascular anatomy. CONTRAST:  62mL OMNIPAQUE IOHEXOL 350 MG/ML SOLN COMPARISON:  None. FINDINGS: Cardiovascular: There is a optimal opacification of the pulmonary arteries. There is no central,segmental, or subsegmental filling defects within the pulmonary arteries. The heart is normal in size. No pericardial effusion or thickening. No evidence right heart strain. There is normal three-vessel brachiocephalic anatomy without proximal stenosis. Scattered aortic atherosclerosis is noted. Mediastinum/Nodes: No hilar, mediastinal, or axillary adenopathy. Thyroid gland, trachea, and esophagus demonstrate no significant findings. Lungs/Pleura: The  lungs are clear. No pleural effusion or pneumothorax. No  airspace consolidation. Upper Abdomen: No acute abnormalities present in the visualized portions of the upper abdomen. Multiple cystic lesions are seen throughout the liver. The in the posterior right liver there is 1 with peripheral calcifications. The patient is status post cholecystectomy. There is coarse calcifications seen within the spleen. Musculoskeletal: 2 surgical drains are seen along the left anterior chest wall. There is a ill-defined heterogeneous collection seen within this area measuring approximately 7.3 x 7.3 x 2.7 cm with foci of air seen within the collection. Two surgical drains are seen overlying the right chest wall. No fluid collections are noted. No acute or significant osseous findings. Review of the MIP images confirms the above findings. IMPRESSION: No central, segmental or subsegmental pulmonary embolism No acute intrathoracic pathology to explain the patient's symptoms Two postsurgical drains along the left anterior chest wall with ill-defined fluid collection containing air and surrounding inflammatory changes which could represent phlegmon/early abscess. Aortic Atherosclerosis (ICD10-I70.0). Electronically Signed   By: Prudencio Pair M.D.   On: 11/20/2020 20:19    Procedures Procedures   Medications Ordered in ED Medications  sodium chloride 0.9 % bolus 500 mL (has no administration in time range)    ED Course  I have reviewed the triage vital signs and the nursing notes.  Pertinent labs & imaging results that were available during my care of the patient were reviewed by me and considered in my medical decision making (see chart for details).  Clinical Course as of 11/20/20 2250  Fri Nov 20, 2020  2121 I spoke with Dr. Bethann Humble who will see the patient. [AW]  2248 The wound was opened at the bedside by general surgery, and it was packed.  The patient did have a large hematoma in this area, and she was instructed on wound care.  She has an appointment in 2 days with  her surgeon.  She will continue her doxycycline. [AW]    Clinical Course User Index [AW] Arnaldo Natal, MD   MDM Rules/Calculators/A&P                          Madison Wheeler presented with fever and pain at her left chest wall incision.  She was evaluated for various sources of infection or fever including pneumonia, pulmonary embolism, urinary tract infection, or soft tissue infection.  She was also complaining of some dyspnea, and she was evaluated for sources of dyspnea such as pneumonia and PE.  Ultimately, it appeared that the patient was suffering from a local wound infection.  General surgery was able to evacuate a large hematoma, and the patient was felt to be suitable for discharge home.  She feels comfortable with her wound care, and she understands the importance of continuing her doxycycline.  Finally, close follow-up has already been arranged for this patient. Final Clinical Impression(s) / ED Diagnoses Final diagnoses:  Wound infection  Hematoma of left chest wall, initial encounter    Rx / DC Orders ED Discharge Orders    None       Arnaldo Natal, MD 11/20/20 2251

## 2020-11-20 NOTE — Procedures (Signed)
The skin at the medial and lateral aspects of the left mastectomy incision was infiltrated with 1% lidocaine. An 11-blade scalpel was used to further open the wound medially and laterally, at the points where the skin had already dehisced. Blood-tinged fluid and clotted blood were then expressed from the wound. There was no purulent drainage, and the wound bed was clean with healthy pink granulation tissue. The drains were visible within the wound, and both were removed since the wound is now open. The wound was irrigated with sterile saline and packed with saline-moistened gauze. ABD pads were applied. The patient tolerated the procedure with no apparent complications.

## 2020-11-20 NOTE — ED Notes (Signed)
Patient dressing changed at this time. Saturated through with dark red bloody drainage. JP drains emptied on left with ~73mL dark red drainage noted.

## 2020-11-20 NOTE — ED Notes (Signed)
Patient and sister given discharge paperwork and instructions. Verbalized understanding of teaching. IV d/c with cath tip intact. Ambulatory to exit in NAD with steady gait.

## 2020-11-20 NOTE — Consult Note (Signed)
Madison Wheeler 1956-06-05  [id number].    Requesting MD: Dr. Joya Gaskins Chief Complaint/Reason for Consult: breast wound  HPI:  Madison Wheeler is a 65 yo female with DCIS of the right breast. She underwent a bilateral mastectomy with right axillary sentinel lymph node biopsy on 3/23 with Dr. Brantley Stage. Several days ago she began having fevers to 103, and was seen in clinic in 4/5. At that time she was noted to have some ecchymosis at the left breast incision with tenderness to palpation, consistent with a hematoma. She was started on oral doxycycline, which she has been taking. She has continued to have fevers, most recently last night, and is now having copius drainage from the incision. Today she came to the ED with increased drainage from her incision on the left, as well as significant pain in the area. In the ED she is afebrile and WBC is normal. A CT chest was done and shows a large pocket of gas and fluid deep to the left mastectomy incision. No abnormalities on the right.   ROS: Review of Systems  Constitutional: Positive for chills, fever and malaise/fatigue.    Family History  Problem Relation Age of Onset  . Breast cancer Sister 81  . Breast cancer Maternal Aunt 5  . Breast cancer Paternal Grandmother 48  . Breast cancer Paternal Aunt 29  . Breast cancer Sister 69       identical twin sister  . Basal cell carcinoma Sister   . Lung cancer Paternal Uncle        dx late 73s  . Breast cancer Cousin        dx 68s; maternal cousin  . Breast cancer Cousin        early 50s; paternal cousin  . Breast cancer Cousin 70       paternal cousin  . Breast cancer Maternal Aunt 60  . Breast cancer Maternal Aunt 58  . Melanoma Father   . Colon cancer Other 35       maternal aunt's granddaughter   . Breast cancer Cousin        dx 77s; maternal cousin  . Basal cell carcinoma Nephew        onset before age 80    Past Medical History:  Diagnosis Date  . Acute bronchitis   . Anxiety   .  Arthritis   . Calculus of kidney   . Cancer [facility]) 09/2020   right breast DCIS  . Family history of breast cancer 10/22/2020  . Family history of lung cancer 10/22/2020  . Family history of malignant neoplasm of breast   . Family history of skin cancer 10/22/2020  . Hyperlipidemia   . Hypertension   . Obesity   . OSA (obstructive sleep apnea)    uses CPAP nightly  . Rosacea   . Unspecified otitis media   . Vitamin D deficiency     Past Surgical History:  Procedure Laterality Date  . ABDOMINAL HYSTERECTOMY    . APPENDECTOMY    . CESAREAN SECTION    . CHOLECYSTECTOMY  2005  . SENTINEL NODE BIOPSY Right 11/04/2020   Procedure: RIGHT SENTINEL LYMPH NODE BIOPSY;  Surgeon: Erroll Luna, MD;  Location: [facility];  Service: General;  Laterality: Right;  . SIMPLE MASTECTOMY WITH AXILLARY SENTINEL NODE BIOPSY Bilateral 11/04/2020   Procedure: BILATERAL SIMPLE MASTECTOMY;  Surgeon: Erroll Luna, MD;  Location: [facility];  Service: General;  Laterality: Bilateral;  . TUBAL LIGATION    .  VESICOVAGINAL FISTULA CLOSURE W/ TAH  1986    Social History:  reports that she has never smoked. She has never used smokeless tobacco. She reports that she does not drink alcohol and does not use drugs.  Allergies: No Known Allergies  (Not in a hospital admission)    Physical Exam: Blood pressure (!) 181/79, pulse (!) 105, temperature 99.3 F (37.4 C), temperature source Oral, resp. rate (!) 21, SpO2 99 %. General: resting comfortably, appears stated age, no apparent distress Neurological: alert and oriented, no focal deficits, cranial nerves grossly in tact HEENT: normocephalic, atraumatic, oropharynx clear, no scleral icterus CV: regular rate and rhythm, extremities warm and well-perfused Respiratory/chest: normal work of breathing, symmetric chest wall expansion. R breast incision is clean, dry and in tact. JP with serosanguinous drainage. L breast incision has  skin dehiscence medially and laterally, with surrounding ecchymosis and drainage of dark sanguinous fluid, consistent with old hematoma. Overlying skin is tender to palpation. L JP x2 do not hold suction. Abdomen: soft, nondistended Extremities: warm and well-perfused, no deformities, moving all extremities spontaneously Psychiatric: normal mood and affect Skin: warm and dry, no jaundice, no rashes or lesions   Results for orders placed or performed during the hospital encounter of 11/20/20 (from the past 48 hour(s))  CBC with Differential     Status: Abnormal   Collection Time: 11/20/20  2:52 PM  Result Value Ref Range   WBC 6.3 4.0 - 10.5 K/uL   RBC 3.50 (L) 3.87 - 5.11 MIL/uL   Hemoglobin 9.7 (L) 12.0 - 15.0 g/dL   HCT 30.4 (L) 36.0 - 46.0 %   MCV 86.9 80.0 - 100.0 fL   MCH 27.7 26.0 - 34.0 pg   MCHC 31.9 30.0 - 36.0 g/dL   RDW 14.4 11.5 - 15.5 %   Platelets 400 150 - 400 K/uL   nRBC 0.0 0.0 - 0.2 %   Neutrophils Relative % 66 %   Neutro Abs 4.2 1.7 - 7.7 K/uL   Lymphocytes Relative 19 %   Lymphs Abs 1.2 0.7 - 4.0 K/uL   Monocytes Relative 8 %   Monocytes Absolute 0.5 0.1 - 1.0 K/uL   Eosinophils Relative 5 %   Eosinophils Absolute 0.3 0.0 - 0.5 K/uL   Basophils Relative 1 %   Basophils Absolute 0.0 0.0 - 0.1 K/uL   Immature Granulocytes 1 %   Abs Immature Granulocytes 0.03 0.00 - 0.07 K/uL    Comment: Performed at [facility], [address], [place], [place] [address]  Comprehensive metabolic panel     Status: Abnormal   Collection Time: 11/20/20  2:52 PM  Result Value Ref Range   Sodium 140 135 - 145 mmol/L   Potassium 3.1 (L) 3.5 - 5.1 mmol/L   Chloride 108 98 - 111 mmol/L   CO2 23 22 - 32 mmol/L   Glucose, Bld 131 (H) 70 - 99 mg/dL    Comment: Glucose reference range applies only to samples taken after fasting for at least 8 hours.   BUN 9 8 - 23 mg/dL   Creatinine, Ser 0.72 0.44 - 1.00 mg/dL   Calcium 8.8 (L) 8.9 - 10.3 mg/dL   Total Protein 6.0 (L) 6.5 -  8.1 g/dL   Albumin 3.3 (L) 3.5 - 5.0 g/dL   AST 21 15 - 41 U/L   ALT 20 0 - 44 U/L   Alkaline Phosphatase 118 38 - 126 U/L   Total Bilirubin 0.4 0.3 - 1.2 mg/dL  GFR, Estimated >60 >60 mL/min    Comment: (NOTE) Calculated using the CKD-EPI Creatinine Equation (2021)    Anion gap 9 5 - 15    Comment: Performed at [facility], [place] [address], [place], [place] [address]  Brain natriuretic peptide     Status: Abnormal   Collection Time: 11/20/20  3:11 PM  Result Value Ref Range   B Natriuretic Peptide 107.4 (H) 0.0 - 100.0 pg/mL    Comment: Performed at [facility] [address], [place], [place] [address]  Troponin I (High Sensitivity)     Status: None   Collection Time: 11/20/20  3:11 PM  Result Value Ref Range   Troponin I (High Sensitivity) 4 <18 ng/L    Comment: (NOTE) Elevated high sensitivity troponin I (hsTnI) values and significant  changes across serial measurements may suggest ACS but many other  chronic and acute conditions are known to elevate hsTnI results.  Refer to the "Links" section for chest pain algorithms and additional  guidance. Performed at [facility], [place] [address] [place], [place] [address]   Lactic acid, plasma     Status: None   Collection Time: 11/20/20  4:38 PM  Result Value Ref Range   Lactic Acid, Venous 1.0 0.5 - 1.9 mmol/L    Comment: Performed at [facility] [address] [place], [place] [address]  D-dimer, quantitative     Status: Abnormal   Collection Time: 11/20/20  6:10 PM  Result Value Ref Range   D-Dimer, Quant 0.85 (H) 0.00 - 0.50 ug/mL-FEU    Comment: (NOTE) At the manufacturer cut-off value of 0.5 g/mL FEU, this assay has a negative predictive value of 95-100%.This assay is intended for use in conjunction with a clinical pretest probability (PTP) assessment model to exclude pulmonary embolism (PE) and deep venous thrombosis (DVT) in outpatients suspected of PE or DVT. Results should be  correlated with clinical presentation. Performed at [facility], [place] [address], [place], [place] [address]   Urinalysis, Routine w reflex microscopic Urine, Clean Catch     Status: None   Collection Time: 11/20/20  6:29 PM  Result Value Ref Range   Color, Urine YELLOW YELLOW   APPearance CLEAR CLEAR   Specific Gravity, Urine 1.014 1.005 - 1.030   pH 5.0 5.0 - 8.0   Glucose, UA NEGATIVE NEGATIVE mg/dL   Hgb urine dipstick NEGATIVE NEGATIVE   Bilirubin Urine NEGATIVE NEGATIVE   Ketones, ur NEGATIVE NEGATIVE mg/dL   Protein, ur NEGATIVE NEGATIVE mg/dL   Nitrite NEGATIVE NEGATIVE   Leukocytes,Ua NEGATIVE NEGATIVE    Comment: Performed at [facility] [address], [place], [place] [address]  POC SARS Coronavirus 2 Ag-ED - Nasal Swab (BD Veritor Kit)     Status: None   Collection Time: 11/20/20  6:45 PM  Result Value Ref Range   SARS Coronavirus 2 Ag NEGATIVE NEGATIVE    Comment: (NOTE) SARS-CoV-2 antigen NOT DETECTED.   Negative results are presumptive.  Negative results do not preclude SARS-CoV-2 infection and should not be used as the sole basis for treatment or other patient management decisions, including infection  control decisions, particularly in the presence of clinical signs and  symptoms consistent with COVID-19, or in those who have been in contact with the virus.  Negative results must be combined with clinical observations, patient history, and epidemiological information. The expected result is Negative.  Fact Sheet for Patients: HandmadeRecipes.com.cy  Fact  Sheet for Healthcare Providers: FuneralLife.at  This test is not yet approved or cleared by the [place] and  has been authorized for detection and/or diagnosis of SARS-CoV-2 by FDA under an Emergency Use Authorization (EUA).  This EUA will remain in effect (meaning this test can be used) for the duration of  the COV ID-19 declaration  under Section 564(b)(1) of the Act, 21 U.S.C. section 360bbb-3(b)(1), unless the authorization is terminated or revoked sooner.     DG Chest 2 View  Result Date: 11/20/2020 CLINICAL DATA:  Shortness of breath.  Recent double mastectomy. EXAM: CHEST - 2 VIEW COMPARISON:  Radiograph 08/18/2011 FINDINGS: The cardiomediastinal contours are normal. The lungs are clear. Pulmonary vasculature is normal. No consolidation, pleural effusion, or pneumothorax. No acute osseous abnormalities are seen. Surgical drains overlie the anterior chest wall. Surgical clips in both axilla. IMPRESSION: 1. No acute intrathoracic finding. 2. Surgical drains overlie the anterior chest wall from recent mastectomy. Electronically Signed   By: Keith Rake M.D.   On: 11/20/2020 15:22   CT Angio Chest PE W/Cm &/Or Wo Cm  Result Date: 11/20/2020 CLINICAL DATA:  Shortness of breath with exertion EXAM: CT ANGIOGRAPHY CHEST WITH CONTRAST TECHNIQUE: Multidetector CT imaging of the chest was performed using the standard protocol during bolus administration of intravenous contrast. Multiplanar CT image reconstructions and MIPs were obtained to evaluate the vascular anatomy. CONTRAST:  22m OMNIPAQUE IOHEXOL 350 MG/ML SOLN COMPARISON:  None. FINDINGS: Cardiovascular: There is a optimal opacification of the pulmonary arteries. There is no central,segmental, or subsegmental filling defects within the pulmonary arteries. The heart is normal in size. No pericardial effusion or thickening. No evidence right heart strain. There is normal three-vessel brachiocephalic anatomy without proximal stenosis. Scattered aortic atherosclerosis is noted. Mediastinum/Nodes: No hilar, mediastinal, or axillary adenopathy. Thyroid gland, trachea, and esophagus demonstrate no significant findings. Lungs/Pleura: The lungs are clear. No pleural effusion or pneumothorax. No airspace consolidation. Upper Abdomen: No acute abnormalities present in the visualized  portions of the upper abdomen. Multiple cystic lesions are seen throughout the liver. The in the posterior right liver there is 1 with peripheral calcifications. The patient is status post cholecystectomy. There is coarse calcifications seen within the spleen. Musculoskeletal: 2 surgical drains are seen along the left anterior chest wall. There is a ill-defined heterogeneous collection seen within this area measuring approximately 7.3 x 7.3 x 2.7 cm with foci of air seen within the collection. Two surgical drains are seen overlying the right chest wall. No fluid collections are noted. No acute or significant osseous findings. Review of the MIP images confirms the above findings. IMPRESSION: No central, segmental or subsegmental pulmonary embolism No acute intrathoracic pathology to explain the patient's symptoms Two postsurgical drains along the left anterior chest wall with ill-defined fluid collection containing air and surrounding inflammatory changes which could represent phlegmon/early abscess. Aortic Atherosclerosis (ICD10-I70.0). Electronically Signed   By: BPrudencio PairM.D.   On: 11/20/2020 20:19      Assessment/Plan 65yo female presenting with persistent fevers and increased drainage from left breast incision 2 weeks following bilateral mastectomy. Exam and imaging are consistent with a hematoma that has started to drain spontaneously, but is not adequately drained. Given fevers, there is concern for infection although there is no surrounding cellulitis. The incision had already started to dehisce in 2 places, so the wound was further opened at the bedside and fluid and clot were evacuated. Since the wound is now open the drains are not functional  and were thus removed. The wound was packed, and the patient and her sister were given instructions to pack with saline wet-to-dry dressings BID (they are both RNs). She should continue taking the doxycycline that was already prescribed. They already have  an appointment with Dr. Brantley Stage this coming Monday.  Michaelle Birks, MD [facility] Surgery General, Hepatobiliary and Pancreatic Surgery 11/20/20 9:56 PM

## 2020-11-20 NOTE — ED Triage Notes (Signed)
Pt presents to the ED s/p double mastectomy 15 days ago. Pt has increased drainage of bloody discharge and sent in from doctor's office for wound recheck. Pt also has increased swelling in low extremities with SOB. Denies CHF.

## 2020-11-20 NOTE — ED Triage Notes (Signed)
Emergency Medicine Provider Triage Evaluation Note  Madison Wheeler , a 65 y.o. female  was evaluated in triage.  Pt complains of shortness of breath with exertion, leg swelling bilaterally, and increased drainage from surgical sites.  Tmax 101.9 in last 24hrs.    Had double mastectomy 15 days prior performed by Dr.Cornett.    Review of Systems  Positive: Fever, shortness of breath, leg swelling Negative: Cough, nausea, vomiting  Physical Exam  BP (!) 168/101 (BP Location: Left Arm)   Pulse (!) 105   Temp 99.3 F (37.4 C) (Oral)   Resp 20   SpO2 100%  Gen:   Awake, no distress   HEENT:  Atraumatic  Resp:  Normal effort, lungs clear to auscultation bilaterally Cardiac:  Tachy at rate of 105 MSK:   Moves extremities without difficulty  Neuro:  Speech clear  Chest:   Bloody drainage from surgical wound to left chest surrounding erythema   Medical Decision Making  Medically screening exam initiated at 2:37 PM.  Appropriate orders placed.  Madison Wheeler was informed that the remainder of the evaluation will be completed by another provider, this initial triage assessment does not replace that evaluation, and the importance of remaining in the ED until their evaluation is complete.  Clinical Impression   Shob, increased drainage from surgical wound;  The patient appears stable so that the remainder of the work up may be completed by another provider.     Loni Beckwith, [place] 11/20/20 1444

## 2020-11-20 NOTE — ED Notes (Signed)
New dressing placed at this time. Dark red drainage noted. Gauze reinforced with tape.

## 2020-11-25 LAB — CULTURE, BLOOD (ROUTINE X 2)
Culture: NO GROWTH
Culture: NO GROWTH

## 2021-08-06 ENCOUNTER — Telehealth: Payer: Self-pay | Admitting: Oncology

## 2021-08-06 NOTE — Telephone Encounter (Signed)
Patient referred by Mayer Camel for Transfer of Care from [facility] for Breast CA.  Appt made 08/26/2021 for Breast CA

## 2021-08-25 NOTE — Progress Notes (Signed)
[facility]  [address] [place],  [place]  [address] [phone]  Clinic Day:  08/26/2021  Referring physician: Raina Wheeler., MD  This document serves as a record of services personally performed by Madison Poisson, MD. It was created on their behalf by Madison Wheeler, a trained medical scribe. The creation of this record is based on the scribe's personal observations and the provider's statements to them.  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 Treated with bilateral mastectomies. She remains without evidence of recurrence at 1 year post op.  Strong family history of malignancy, especially breast cancer. 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 Due to her significant family history, she opted for bilateral mastectomies. She did not require any adjuvant therapy and has remained without evidence of recurrence. Her diagnosis, prognosis, and pathology were reviewed. As she is doing well, we will plan to follow with her annually, and so will see her back in 1 year for repeat evaluation. She and her husband understand and agree with this plan of care. I have answered their questions and they know to all with any concerns.  Thank you for the opportunity to participate in the care of your patients  I provided 50 minutes of face-to-face time during this this encounter and > 50% was spent counseling as documented under my assessment and plan.    Madison Kaplan, MD [facility] [address] [place] [address] Dept: [phone] Dept Fax: [phone]   CHIEF COMPLAINT:  CC: Stage 0 ductal carcinoma in situ of the right breast  Current Treatment:   Observation   HISTORY OF PRESENT ILLNESS:  Madison Wheeler is a 66 y.o. female referred by Madison Cha, NP, for the continued evaluation and management due to her history of breast cancer. This was found on bilateral screening mammogram in January 2022 which revealed right breast calcifications. Diagnostic unilateral right mammogram was pursued in February and confirmed calcifications spanning 0.9 cm and enlarged left axillary lymph nodes. Biopsy was obtained on February 25th and revealed ductal carcinoma in situ. Estrogen receptor was positive at 95% and progesterone receptor was positive at 90%. She underwent genetic testing due to her strong family history of malignancy, especially breast cancer, which revealed a variant of uncertain significant (VUS) of the NF1 gene. In view of her significant family history the patient opted to undergo bilateral mastectomy. This was pursued as well as sentinel lymph node excision on March 23rd and final pathology from this procedure confirmed ductal carcinoma in situ, high grade, with calcification, 0.8 cm, of the right breast. There was no invasive carcinoma seen. Resection margins were negative. Left breast revealed fibrocystic change, including sclerosing adenosis, usual ductal hyperplasia and calcifications. Negative for carcinoma. One lymph node was negative for malignancy (0/1). She states that she did not do well following her procedure. She developed a seroma on the right which spontaneously opened and also developed an abscess and was treated at [facility] in April accordingly.   INTERVAL HISTORY:  I have reviewed her chart and materials related to her cancer extensively and collaborated history with the patient. Summary of oncologic history is as follows: Oncology History  Ductal  carcinoma in situ (DCIS) of right breast  10/13/2020 Initial Diagnosis   Screening mammogram showed right breast calcifications. Diagnostic mammogram showed right breast  calcifications spanning 0.9cm and enlarged left axillary lymph nodes. 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   10/21/2020 Cancer Staging   Staging form: Breast, AJCC 8th Edition - Clinical stage from 10/21/2020: Stage 0 (cTis (DCIS), cN0, cM0, G3, ER+, PR+, HER2: Not Assessed) - Signed by Madison Lose, MD on 10/21/2020 Stage prefix: Initial diagnosis Laterality: Right Staged by: Pathologist and managing physician Stage used in treatment planning: Yes National guidelines used in treatment planning: Yes Type of national guideline used in treatment planning: NCCN    11/04/2020 Surgery   Bilateral mastectomies ([facility]):  Right breast: high grade DCIS, 0.8cm clear margins Left breast: fibrocystic changes and no evidence of carcinoma, with one right axillary lymph node negative for carcinoma.    11/11/2020 Genetic Testing   Negative hereditary cancer genetic testing: no pathogenic variants detected in [facility] CancerNext-Expanded +RNAinsight Panel.  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 The report date is November 11, 2020.   The CancerNext-Expanded gene panel offered by [facility] and includes sequencing, rearrangement, and RNA analysis for the following 77 genes: AIP, ALK, APC, ATM, AXIN2, BAP1, BARD1, BLM, BMPR1A, BRCA1, BRCA2, BRIP1, CDC73, CDH1, CDK4, CDKN1B, CDKN2A, CHEK2, CTNNA1, DICER1, FANCC, FH, FLCN, GALNT12, KIF1B, LZTR1, MAX, MEN1, MET, MLH1, MSH2, MSH3, MSH6, MUTYH, NBN, NF1, NF2, NTHL1, PALB2, PHOX2B, PMS2, POT1, PRKAR1A, PTCH1, PTEN, RAD51C, RAD51D, RB1, RECQL, RET, SDHA, SDHAF2, SDHB, SDHC, SDHD, SMAD4, SMARCA4, SMARCB1, SMARCE1, STK11, SUFU, TMEM127, TP53, TSC1, TSC2, VHL and XRCC2 (sequencing and deletion/duplication); EGFR, EGLN1, HOXB13, KIT, MITF, PDGFRA, POLD1, and POLE (sequencing only); EPCAM and GREM1 (deletion/duplication only).      Madison Wheeler states that she is doing well now and denies complaints. She states that  she has been healthy other than hypertension. She undergoes routine lab work through Madison Wheeler office and is scheduled for February. Her  appetite is good, and she is eating well. She denies any significant unintentional weight loss or gain.  She denies fever, chills or other signs of infection.  She denies nausea, vomiting, bowel issues, or abdominal pain.  She denies sore throat, cough, dyspnea, or chest pain. Her husband accompanies her today. She gave birth to her 1st child at age 53. She is status post hysterectomy at age 38, but her ovaries are still in tact. She has never received hormone replacement therapy.  HISTORY:   Past Medical History:  Diagnosis Date   Acute bronchitis    Anxiety    Arthritis    Calculus of kidney    Cancer ([facility]) 09/2020   right breast DCIS   Family history of breast cancer 10/22/2020   Family history of lung cancer 10/22/2020   Family history of malignant neoplasm of breast    Family history of skin cancer 10/22/2020   Hyperlipidemia    Hypertension    Obesity    OSA (obstructive sleep apnea)    uses CPAP nightly   Rosacea    Unspecified otitis media    Vitamin D deficiency     Past Surgical History:  Procedure Laterality Date   ABDOMINAL HYSTERECTOMY     APPENDECTOMY     CESAREAN SECTION     CHOLECYSTECTOMY  2005   SENTINEL NODE BIOPSY Right 11/04/2020   Procedure: RIGHT SENTINEL LYMPH NODE BIOPSY;  Surgeon: Erroll Luna, MD;  Location: [facility];  Service: General;  Laterality: Right;   SIMPLE MASTECTOMY WITH AXILLARY SENTINEL NODE BIOPSY Bilateral 11/04/2020   Procedure: BILATERAL SIMPLE MASTECTOMY;  Surgeon: Erroll Luna, MD;  Location: [facility];  Service: General;  Laterality: Bilateral;   TUBAL LIGATION     VESICOVAGINAL FISTULA CLOSURE W/ TAH  1986    Family History  Problem Relation Age of Onset   Breast cancer Sister 61   Breast cancer Maternal Aunt 55   Breast cancer Paternal Grandmother 50    Breast cancer Paternal Aunt 67   Breast cancer Sister 61       identical twin sister   Basal cell carcinoma Sister    Lung cancer Paternal Uncle        dx late 30s   Breast cancer Cousin        dx 4s; maternal cousin   Breast cancer Cousin        early 47s; paternal cousin   Breast cancer Cousin 53       paternal cousin   Breast cancer Maternal Aunt 60   Breast cancer Maternal Aunt 73   Melanoma Father    Colon cancer Other 8       maternal aunt's granddaughter    Breast cancer Cousin        dx 30s; maternal cousin   Basal cell carcinoma Nephew        onset before age 21    Social History:  reports that she has never smoked. She has never used smokeless tobacco. She reports that she does not drink alcohol and does not use drugs.The patient is accompanied by her husband today. She is married and lives at home with her spouse. She has 2 children. 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  Allergies: No Known Allergies  Current Medications: Current Outpatient Medications  Medication Sig Dispense Refill   Ascorbic Acid (VITAMIN C PO) Take 1,000 mg by mouth daily as needed. Only when sick     atorvastatin (LIPITOR) 10 MG tablet Take 10 mg by mouth daily.     Calcium Carb-Cholecalciferol [phone] MG-UNIT CAPS Take by mouth.     cloNIDine (CATAPRES) 0.2 MG tablet Take 0.2 mg by mouth 2 (two) times daily.     No current facility-administered medications for this visit.    REVIEW OF SYSTEMS:  Review of Systems  Constitutional: Negative.  Negative for appetite change, chills, fatigue, fever and unexpected weight change.  HENT:  Negative.    Eyes: Negative.   Respiratory: Negative.  Negative for chest tightness, cough, hemoptysis, shortness of breath and wheezing.   Cardiovascular: Negative.  Negative for chest pain, leg swelling and palpitations.  Gastrointestinal: Negative.   Negative for abdominal distention, abdominal pain, blood in stool, constipation, diarrhea, nausea and vomiting.  Endocrine: Negative.   Genitourinary: Negative.  Negative for difficulty urinating, dysuria, frequency and hematuria.   Musculoskeletal: Negative.  Negative for arthralgias, back pain, flank pain, gait problem and myalgias.  Skin: Negative.   Neurological: Negative.  Negative for dizziness, extremity weakness, gait problem, headaches, light-headedness, numbness, seizures and speech difficulty.  Hematological: Negative.   Psychiatric/Behavioral: Negative.  Negative for depression and sleep disturbance. The patient is not nervous/anxious.      VITALS:  Blood pressure (!) 189/92, pulse 78, temperature 98.3 F (36.8 C), temperature source Oral, resp. rate 18, height 5' (1.524 m),  weight 221 lb (100.2 kg), SpO2 99 %.  Wt Readings from Last 3 Encounters:  08/26/21 221 lb (100.2 kg)  11/11/20 224 lb (101.6 kg)  11/04/20 220 lb 7.4 oz (100 kg)    Body mass index is 43.16 kg/m.  Performance status (ECOG): 0 - Asymptomatic  PHYSICAL EXAM:  Physical Exam Constitutional:      General: She is not in acute distress.    Appearance: Normal appearance. She is normal weight.  HENT:     Head: Normocephalic and atraumatic.  Eyes:     General: No scleral icterus.    Extraocular Movements: Extraocular movements intact.     Conjunctiva/sclera: Conjunctivae normal.     Pupils: Pupils are equal, round, and reactive to light.  Cardiovascular:     Rate and Rhythm: Normal rate and regular rhythm.     Pulses: Normal pulses.     Heart sounds: Normal heart sounds. No murmur heard.   No friction rub. No gallop.  Pulmonary:     Effort: Pulmonary effort is normal. No respiratory distress.     Breath sounds: Normal breath sounds.  Chest:  Breasts:    Right: Absent.     Left: Absent.     Comments: Bilateral mastectomies are negative.  Abdominal:     General: Bowel sounds are normal. There is  no distension.     Palpations: Abdomen is soft. There is no hepatomegaly, splenomegaly or mass.     Tenderness: There is no abdominal tenderness.  Musculoskeletal:        General: Normal range of motion.     Cervical back: Normal range of motion and neck supple.     Right lower leg: No edema.     Left lower leg: No edema.  Lymphadenopathy:     Cervical: No cervical adenopathy.  Skin:    General: Skin is warm and dry.  Neurological:     General: No focal deficit present.     Mental Status: She is alert and oriented to person, place, and time. Mental status is at baseline.  Psychiatric:        Mood and Affect: Mood normal.        Behavior: Behavior normal.        Thought Content: Thought content normal.        Judgment: Judgment normal.     LABS:   CBC Latest Ref Rng & Units 11/20/2020 10/21/2020  WBC 4.0 - 10.5 K/uL 6.3 8.0  Hemoglobin 12.0 - 15.0 g/dL 9.7(L) 13.8  Hematocrit 36.0 - 46.0 % 30.4(L) 40.8  Platelets 150 - 400 K/uL 400 315   CMP Latest Ref Rng & Units 11/20/2020 10/21/2020  Glucose 70 - 99 mg/dL 131(H) 101(H)  BUN 8 - 23 mg/dL 9 11  Creatinine 0.44 - 1.00 mg/dL 0.72 0.72  Sodium 135 - 145 mmol/L 140 140  Potassium 3.5 - 5.1 mmol/L 3.1(L) 4.0  Chloride 98 - 111 mmol/L 108 107  CO2 22 - 32 mmol/L 23 23  Calcium 8.9 - 10.3 mg/dL 8.8(L) 9.3  Total Protein 6.5 - 8.1 g/dL 6.0(L) 7.4  Total Bilirubin 0.3 - 1.2 mg/dL 0.4 0.5  Alkaline Phos 38 - 126 U/L 118 137(H)  AST 15 - 41 U/L 21 19  ALT 0 - 44 U/L 20 25    STUDIES:  No results found.   SURGICAL PATHOLOGY: 10/09/2020 Breast, right, needle core biopsy, lower outer:  Ductal carcinoma in situ  Sclerosing adenosis ER: Positive 95% PR: Positive 90%  FINAL  MICROSCOPIC DIAGNOSIS: 11/04/2020  A. BREAST, LEFT, MASTECTOMY:  - Fibrocystic change, including sclerosing adenosis, usual ductal  hyperplasia and calcifications  - Negative for carcinoma   B. BREAST, RIGHT, MASTECTOMY:  - Ductal carcinoma in situ,  high-grade with calcifications, 0.8 cm  - Resection margins are negative for DCIS  - Background breast parenchyma with fibrocystic change including  sclerosing adenosis, usual ductal hyperplasia, apocrine metaplasia and  calcifications  - Biopsy site changes  - See oncology table   C. LYMPH NODE, RIGHT AXILLARY, SENTINEL, EXCISION:  - Lymph node, negative for carcinoma (0/1)   ONCOLOGY TABLE:   DCIS OF THE BREAST:  Resection   Procedure: Mastectomy  Specimen Laterality: Right  Histologic Type: Ductal carcinoma in situ  Size of DCIS: 0.8 cm  Nuclear Grade: High-grade  Necrosis: Focal  Margins: All margins negative for DCIS     I, Rita Ohara, am acting as scribe for Madison Kaplan, MD  I have reviewed this report as typed by the medical scribe, and it is complete and accurate.

## 2021-08-26 ENCOUNTER — Encounter: Payer: Self-pay | Admitting: Oncology

## 2021-08-26 ENCOUNTER — Inpatient Hospital Stay: Payer: Medicare Other | Attending: Oncology | Admitting: Oncology

## 2021-08-26 VITALS — BP 189/92 | HR 78 | Temp 98.3°F | Resp 18 | Ht 60.0 in | Wt 221.0 lb

## 2021-08-26 DIAGNOSIS — Z801 Family history of malignant neoplasm of trachea, bronchus and lung: Secondary | ICD-10-CM

## 2021-08-26 DIAGNOSIS — D0511 Intraductal carcinoma in situ of right breast: Secondary | ICD-10-CM | POA: Diagnosis not present

## 2021-08-26 DIAGNOSIS — Z803 Family history of malignant neoplasm of breast: Secondary | ICD-10-CM

## 2021-08-26 DIAGNOSIS — Z808 Family history of malignant neoplasm of other organs or systems: Secondary | ICD-10-CM | POA: Diagnosis not present

## 2022-08-24 NOTE — Progress Notes (Signed)
[facility]  [address] 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 This was treated with bilateral mastectomies. She remains without evidence of recurrence at 2 year post op.  Strong family history of malignancy, especially breast cancer. 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 Due to her significant family history, she opted for bilateral mastectomies. She did not require any adjuvant therapy and has remained without evidence of recurrence.  As she is doing well, we will plan to follow with her annually, and so will see her back in 1 year for repeat evaluation. She and her husband understand and agree with this plan of care. I have answered their questions and they know to all with any concerns.  I provided 10 minutes of face-to-face time during this this encounter and > 50% was spent counseling as documented under my assessment and plan.    Derwood Kaplan, MD [facility] [address] [place] [address] Dept: [phone] Dept Fax: [phone]   CHIEF COMPLAINT:  CC: Stage 0 ductal carcinoma in situ of the right breast  Current Treatment:  Observation   HISTORY OF PRESENT ILLNESS:  Breelynn Bankert is a 67 y.o. female referred by Leeroy Cha, NP, for the continued evaluation and management due to her history of breast cancer. This was found on bilateral screening mammogram in January 2022 which revealed right breast calcifications. Diagnostic unilateral right mammogram was pursued in February and confirmed calcifications  spanning 0.9 cm and enlarged left axillary lymph nodes. Biopsy was obtained on February 25th and revealed ductal carcinoma in situ. Estrogen receptor was positive at 95% and progesterone receptor was positive at 90%. She underwent genetic testing due to her strong family history of malignancy, especially breast cancer, which revealed a variant of uncertain significant (VUS) of the NF1 gene. In view of her significant family history the patient opted to undergo bilateral mastectomy. This was pursued as well as sentinel lymph node excision on March 23rd and final pathology from this procedure confirmed ductal carcinoma in situ, high grade, with calcification, 0.8 cm, of the right breast. There was no invasive carcinoma seen. Resection margins were negative. Left breast revealed fibrocystic change, including sclerosing adenosis, usual ductal hyperplasia and calcifications. Negative for carcinoma. One lymph node was negative for malignancy (0/1). She states that she did not do well following her procedure. She developed a seroma on the right which spontaneously opened and also developed an abscess and was treated at [facility] in April accordingly. She gave birth to her 1st child at age 56. She is status post hysterectomy at age 20, but her ovaries are still in tact. She has never received hormone replacement therapy.  I have reviewed her chart and materials related to her cancer extensively and collaborated history with the patient. Summary of oncologic history is as follows: Oncology History  Ductal carcinoma in situ (DCIS) of right breast  10/13/2020 Initial Diagnosis   Screening mammogram showed right breast calcifications. Diagnostic mammogram showed right breast calcifications spanning 0.9cm and enlarged left axillary lymph nodes. Biopsy  showed high grade ductal carcinoma in situ, ER+ 95%, PR+ 90%.   10/21/2020 Cancer Staging   Staging form: Breast, AJCC 8th Edition - Clinical stage from 10/21/2020: Stage 0  (cTis (DCIS), cN0, cM0, G3, ER+, PR+, HER2: Not Assessed) - Signed by Nicholas Lose, MD on 10/21/2020 Stage prefix: Initial diagnosis Laterality: Right Staged by: Pathologist and managing physician Stage used in treatment planning: Yes National guidelines used in treatment planning: Yes Type of national guideline used in treatment planning: NCCN   11/04/2020 Surgery   Bilateral mastectomies ([facility]):  Right breast: high grade DCIS, 0.8cm clear margins Left breast: fibrocystic changes and no evidence of carcinoma, with one right axillary lymph node negative for carcinoma.    11/11/2020 Genetic Testing   Negative hereditary cancer genetic testing: no pathogenic variants detected in [facility] CancerNext-Expanded +RNAinsight Panel.  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 The report date is November 11, 2020.   The CancerNext-Expanded gene panel offered by [facility] and includes sequencing, rearrangement, and RNA analysis for the following 77 genes: AIP, ALK, APC, ATM, AXIN2, BAP1, BARD1, BLM, BMPR1A, BRCA1, BRCA2, BRIP1, CDC73, CDH1, CDK4, CDKN1B, CDKN2A, CHEK2, CTNNA1, DICER1, FANCC, FH, FLCN, GALNT12, KIF1B, LZTR1, MAX, MEN1, MET, MLH1, MSH2, MSH3, MSH6, MUTYH, NBN, NF1, NF2, NTHL1, PALB2, PHOX2B, PMS2, POT1, PRKAR1A, PTCH1, PTEN, RAD51C, RAD51D, RB1, RECQL, RET, SDHA, SDHAF2, SDHB, SDHC, SDHD, SMAD4, SMARCA4, SMARCB1, SMARCE1, STK11, SUFU, TMEM127, TP53, TSC1, TSC2, VHL and XRCC2 (sequencing and deletion/duplication); EGFR, EGLN1, HOXB13, KIT, MITF, PDGFRA, POLD1, and POLE (sequencing only); EPCAM and GREM1 (deletion/duplication only).      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 She states that she feels well and has no complaints. Her bilateral mastectomies are negative. Her labs will be checked by Dr.Grisso and I will see her a year from today. She denies signs of infection such as sore throat,  sinus drainage, cough, or urinary symptoms.  She denies fevers or recurrent chills. She denies pain. She denies nausea, vomiting, chest pain, dyspnea or cough. Her weight has increased 4 pounds over last year .  HISTORY:   Past Medical History:  Diagnosis Date   Acute bronchitis    Anxiety    Arthritis    Calculus of kidney    Cancer ([place]) 09/2020   right breast DCIS   Family history of breast cancer 10/22/2020   Family history of lung cancer 10/22/2020   Family history of malignant neoplasm of breast    Family history of skin cancer 10/22/2020   Hyperlipidemia    Hypertension    Obesity    OSA (obstructive sleep apnea)    uses CPAP nightly   Rosacea    Unspecified otitis media    Vitamin D deficiency     Past Surgical History:  Procedure Laterality Date   ABDOMINAL HYSTERECTOMY     APPENDECTOMY     CESAREAN SECTION     CHOLECYSTECTOMY  2005   SENTINEL NODE BIOPSY Right 11/04/2020   Procedure: RIGHT SENTINEL LYMPH NODE BIOPSY;  Surgeon: Erroll Luna, MD;  Location: [place];  Service: General;  Laterality: Right;   SIMPLE MASTECTOMY WITH AXILLARY SENTINEL NODE BIOPSY Bilateral 11/04/2020   Procedure: BILATERAL SIMPLE MASTECTOMY;  Surgeon: Erroll Luna, MD;  Location: [facility];  Service: General;  Laterality: Bilateral;   [place]  Family History  Problem Relation Age of Onset   Breast cancer Sister 25   Breast cancer Maternal Aunt 68   Breast cancer Paternal Grandmother 23   Breast cancer Paternal Aunt 4   Breast cancer Sister 30       identical twin sister   Basal cell carcinoma Sister    Lung cancer Paternal Uncle        dx late 89s   Breast cancer Cousin        dx 2s; maternal cousin   Breast cancer Cousin        early 73s; paternal cousin   Breast cancer Cousin 4       paternal cousin   Breast cancer Maternal Aunt 60   Breast cancer Maternal Aunt 55   Melanoma  Father    Colon cancer Other 57       maternal aunt's granddaughter    Breast cancer Cousin        dx 52s; maternal cousin   Basal cell carcinoma Nephew        onset before age 8    Social History:  reports that she has never smoked. She has never used smokeless tobacco. She reports that she does not drink alcohol and does not use drugs.The patient is accompanied by her husband today. She is married and lives at home with her spouse. She has 2 children. 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  Allergies: No Known Allergies  Current Medications: Current Outpatient Medications  Medication Sig Dispense Refill   Ascorbic Acid (VITAMIN C PO) Take 1,000 mg by mouth daily as needed. Only when sick     aspirin 81 MG chewable tablet Chew 1 tablet by mouth daily.     atorvastatin (LIPITOR) 10 MG tablet Take 10 mg by mouth daily.     Calcium Carb-Cholecalciferol [id number] MG-UNIT CAPS Take by mouth.     Cholecalciferol (VITAMIN D-1000 MAX ST) 25 MCG (1000 UT) tablet Take by mouth.     cloNIDine (CATAPRES) 0.2 MG tablet Take 0.2 mg by mouth 2 (two) times daily.     No current facility-administered medications for this visit.    REVIEW OF SYSTEMS:  Review of Systems  Constitutional: Negative.  Negative for appetite change, chills, diaphoresis, fatigue, fever and unexpected weight change.  HENT:  Negative.  Negative for hearing loss, lump/mass, mouth sores, nosebleeds, sore throat, tinnitus, trouble swallowing and voice change.   Eyes: Negative.  Negative for eye problems and icterus.  Respiratory: Negative.  Negative for chest tightness, cough, hemoptysis, shortness of breath and wheezing.   Cardiovascular: Negative.  Negative for chest pain, leg swelling and palpitations.  Gastrointestinal: Negative.  Negative for abdominal distention, abdominal pain, blood in stool, constipation,  diarrhea, nausea, rectal pain and vomiting.  Endocrine: Negative.  Negative for hot flashes.  Genitourinary: Negative.  Negative for bladder incontinence, difficulty urinating, dyspareunia, dysuria, frequency, hematuria, menstrual problem, nocturia, pelvic pain, vaginal bleeding and vaginal discharge.   Musculoskeletal: Negative.  Negative for arthralgias, back pain, flank pain, gait problem, myalgias, neck pain and neck stiffness.  Skin: Negative.  Negative for itching, rash and wound.  Neurological: Negative.  Negative for dizziness, extremity weakness, gait problem, headaches, light-headedness, numbness, seizures and speech difficulty.  Hematological: Negative.  Negative for adenopathy. Does not bruise/bleed easily.  Psychiatric/Behavioral: Negative.  Negative for confusion, decreased concentration, depression, sleep disturbance and  suicidal ideas. The patient is not nervous/anxious.       VITALS:  Blood pressure (!) 148/71, pulse 79, temperature 98.4 F (36.9 C), temperature source Oral, resp. rate 16, height 5' (1.524 m), weight 225 lb 4.8 oz (102.2 kg), SpO2 100 %.  Wt Readings from Last 3 Encounters:  08/26/22 225 lb 4.8 oz (102.2 kg)  08/26/21 221 lb (100.2 kg)  11/11/20 224 lb (101.6 kg)    Body mass index is 44 kg/m.  Performance status (ECOG): 0 - Asymptomatic  PHYSICAL EXAM:  Physical Exam Vitals and nursing note reviewed.  Constitutional:      General: She is not in acute distress.    Appearance: Normal appearance. She is normal weight. She is not ill-appearing, toxic-appearing or diaphoretic.  HENT:     Head: Normocephalic and atraumatic.     Right Ear: Tympanic membrane, ear canal and external ear normal. There is no impacted cerumen.     Left Ear: Tympanic membrane, ear canal and external ear normal. There is no impacted cerumen.     Nose: Nose normal. No congestion or rhinorrhea.     Mouth/Throat:     Mouth: Mucous membranes are moist.     Pharynx: Oropharynx is  clear. No oropharyngeal exudate or posterior oropharyngeal erythema.  Eyes:     General: No scleral icterus.       Right eye: No discharge.        Left eye: No discharge.     Extraocular Movements: Extraocular movements intact.     Conjunctiva/sclera: Conjunctivae normal.     Pupils: Pupils are equal, round, and reactive to light.  Neck:     Vascular: No carotid bruit.  Cardiovascular:     Rate and Rhythm: Normal rate and regular rhythm.     Pulses: Normal pulses.     Heart sounds: Normal heart sounds. No murmur heard.    No friction rub. No gallop.  Pulmonary:     Effort: Pulmonary effort is normal. No respiratory distress.     Breath sounds: Normal breath sounds. No stridor. No wheezing, rhonchi or rales.  Chest:     Chest wall: No tenderness.  Breasts:    Right: Absent.     Left: Absent.     Comments: Bilateral mastectomies are negative.   Abdominal:     General: Bowel sounds are normal. There is no distension.     Palpations: Abdomen is soft. There is no hepatomegaly, splenomegaly or mass.     Tenderness: There is no abdominal tenderness. There is no right CVA tenderness, left CVA tenderness, guarding or rebound.     Hernia: No hernia is present.  Musculoskeletal:        General: No swelling, tenderness, deformity or signs of injury. Normal range of motion.     Cervical back: Normal range of motion and neck supple. No rigidity or tenderness.     Right lower leg: No edema.     Left lower leg: No edema.  Lymphadenopathy:     Cervical: No cervical adenopathy.  Skin:    General: Skin is warm and dry.     Coloration: Skin is not jaundiced or pale.     Findings: No bruising, erythema, lesion or rash.  Neurological:     General: No focal deficit present.     Mental Status: She is alert and oriented to person, place, and time. Mental status is at baseline.     Cranial Nerves: No cranial nerve deficit.  Sensory: No sensory deficit.     Motor: No weakness.      Coordination: Coordination normal.     Gait: Gait normal.     Deep Tendon Reflexes: Reflexes normal.  Psychiatric:        Mood and Affect: Mood normal.        Behavior: Behavior normal.        Thought Content: Thought content normal.        Judgment: Judgment normal.      LABS:   CBC  Ref Range & Units   04/13/2021  WBC 4.4 - 11.0 x 10*3/uL 6.4  RBC 4.10 - 5.10 x 10*6/uL 5.62 High   Hemoglobin 12.3 - 15.3 G/DL 14.0  Hematocrit 35.9 - 44.6 % 41.9  MCV 80.0 - 96.0 FL 74.5 Low   MCH 27.5 - 33.2 PG 24.9 Low    CMP  Ref Range & Units   04/13/2021  Sodium 135 - 146 MMOL/L 142  Potassium 3.5 - 5.3 MMOL/L 4.0  Chloride 98 - 110 MMOL/L 109  CO2 21 - 31 MMOL/L 23  BUN 8 - 24 MG/DL 12  Glucose 70 - 99 MG/DL 93  Creatinine 0.60 - 1.20 MG/DL 0.55 Low   Calcium 8.5 - 10.5 MG/DL 8.9  Total Protein 6.4 - 8.9 G/DL 6.8  Albumin 3.5 - 5.7 G/DL 4.4  Total Bilirubin 0.0 - 1.0 MG/DL 0.8  Alkaline Phosphatase 34 - 104 IU/L or U/L 147 High   AST (SGOT) 13 - 39 IU/L or U/L 23  ALT (SGPT) 7 - 52 IU/L or U/L 28    Ref Range & Units 04/13/2021  TSH 0.45 - 5.00 UIU/ML 1.53       Latest Ref Rng & Units 11/20/2020    2:52 PM 10/21/2020   12:10 PM  CBC  WBC 4.0 - 10.5 K/uL 6.3  8.0   Hemoglobin 12.0 - 15.0 g/dL 9.7  13.8   Hematocrit 36.0 - 46.0 % 30.4  40.8   Platelets 150 - 400 K/uL 400  315       Latest Ref Rng & Units 11/20/2020    2:52 PM 10/21/2020   12:10 PM  CMP  Glucose 70 - 99 mg/dL 131  101   BUN 8 - 23 mg/dL 9  11   Creatinine 0.44 - 1.00 mg/dL 0.72  0.72   Sodium 135 - 145 mmol/L 140  140   Potassium 3.5 - 5.1 mmol/L 3.1  4.0   Chloride 98 - 111 mmol/L 108  107   CO2 22 - 32 mmol/L 23  23   Calcium 8.9 - 10.3 mg/dL 8.8  9.3   Total Protein 6.5 - 8.1 g/dL 6.0  7.4   Total Bilirubin 0.3 - 1.2 mg/dL 0.4  0.5   Alkaline Phos 38 - 126 U/L 118  137   AST 15 - 41 U/L 21  19   ALT 0 - 44 U/L 20  25     STUDIES:  No results found.   SURGICAL PATHOLOGY: 10/09/2020 Breast,  right, needle core biopsy, lower outer:  Ductal carcinoma in situ  Sclerosing adenosis ER: Positive 95% PR: Positive 90%  FINAL MICROSCOPIC DIAGNOSIS: 11/04/2020  A. BREAST, LEFT, MASTECTOMY:  - Fibrocystic change, including sclerosing adenosis, usual ductal  hyperplasia and calcifications  - Negative for carcinoma   B. BREAST, RIGHT, MASTECTOMY:  - Ductal carcinoma in situ, high-grade with calcifications, 0.8 cm  - Resection margins are negative for DCIS  - Background breast parenchyma  with fibrocystic change including  sclerosing adenosis, usual ductal hyperplasia, apocrine metaplasia and  calcifications  - Biopsy site changes  - See oncology table   C. LYMPH NODE, RIGHT AXILLARY, SENTINEL, EXCISION:  - Lymph node, negative for carcinoma (0/1)   ONCOLOGY TABLE:   DCIS OF THE BREAST:  Resection   Procedure: Mastectomy  Specimen Laterality: Right  Histologic Type: Ductal carcinoma in situ  Size of DCIS: 0.8 cm  Nuclear Grade: High-grade  Necrosis: Focal  Margins: All margins negative for DCIS      I,Jasmine M Lassiter,acting as a scribe for Derwood Kaplan, MD.,have documented all relevant documentation on the behalf of Derwood Kaplan, MD,as directed by  Derwood Kaplan, MD while in the presence of Derwood Kaplan, MD.

## 2022-08-26 ENCOUNTER — Telehealth: Payer: Self-pay | Admitting: Oncology

## 2022-08-26 ENCOUNTER — Encounter: Payer: Self-pay | Admitting: Oncology

## 2022-08-26 ENCOUNTER — Inpatient Hospital Stay: Payer: Medicare Other | Attending: Oncology | Admitting: Oncology

## 2022-08-26 VITALS — BP 148/71 | HR 79 | Temp 98.4°F | Resp 16 | Ht 60.0 in | Wt 225.3 lb

## 2022-08-26 DIAGNOSIS — D0511 Intraductal carcinoma in situ of right breast: Secondary | ICD-10-CM

## 2022-08-26 NOTE — Telephone Encounter (Signed)
Patient has been scheduled for follow-up visit per 08/26/22 LOS.  Pt given an appt calendar with date and time.

## 2022-09-27 ENCOUNTER — Ambulatory Visit (INDEPENDENT_AMBULATORY_CARE_PROVIDER_SITE_OTHER): Payer: Medicare Other | Admitting: Podiatry

## 2022-09-27 ENCOUNTER — Ambulatory Visit (INDEPENDENT_AMBULATORY_CARE_PROVIDER_SITE_OTHER): Payer: Medicare Other

## 2022-09-27 DIAGNOSIS — M778 Other enthesopathies, not elsewhere classified: Secondary | ICD-10-CM

## 2022-09-27 DIAGNOSIS — M722 Plantar fascial fibromatosis: Secondary | ICD-10-CM | POA: Diagnosis not present

## 2022-09-27 MED ORDER — MELOXICAM 15 MG PO TABS: 15.0000 mg | ORAL_TABLET | Freq: Every day | ORAL | 0 refills | Status: DC

## 2022-09-27 NOTE — Patient Instructions (Signed)

## 2022-09-27 NOTE — Progress Notes (Signed)
  Subjective:  Patient ID: Madison Wheeler, female    DOB: 1955/11/29,  MRN: [id number]  Chief Complaint  Patient presents with   Foot Pain    Right heel pain     67 y.o. female presents with the above complaint.  Patient states she has pain in her right heel.  This is been going on for quite a while.  She says it has gotten worse recently and she is having trouble when she steps down or gets out of bed in the morning.  She has been trying to use a night splint but does not feel it is helping at all.  She does not have any inserts for shoes and is not taking any medications currently.   Review of Systems: Negative except as noted in the HPI. Denies N/V/F/Ch.   Objective:  There were no vitals filed for this visit. There is no height or weight on file to calculate BMI. Constitutional Well developed. Well nourished.  Vascular Dorsalis pedis pulses palpable bilaterally. Posterior tibial pulses palpable bilaterally. Capillary refill normal to all digits.  No cyanosis or clubbing noted. Pedal hair growth normal.  Neurologic Normal speech. Oriented to person, place, and time. Epicritic sensation to light touch grossly present bilaterally.  Dermatologic Nails well groomed and normal in appearance. No open wounds. No skin lesions.  Orthopedic: Normal joint ROM without pain or crepitus bilaterally. No visible deformities. Tender to palpation at the calcaneal tuber right. No pain with calcaneal squeeze right. Ankle ROM diminished range of motion right. Silfverskiold Test: negative right.   Radiographs: Taken and reviewed. No acute fractures or dislocations. No evidence of stress fracture.  Plantar heel spur absent. Posterior heel spur absent.   Assessment:   1. Plantar fasciitis of right foot   2. Capsulitis of foot    Plan:  Patient was evaluated and treated and all questions answered.  Plantar Fasciitis, right - XR reviewed as above.  - Educated on icing and stretching.  Instructions given.  - Injection delivered to the plantar fascia as below. - DME: PowerStep orthotics dispensed, patient has night splint will continue this if it is helping otherwise discontinue use - Pharmacologic management: Meloxicam 15 mg take once daily at night for pain and inflammation for the next 30 days dispense 30.. Educated on risks/benefits and proper taking of medication.  Procedure: Injection Tendon/Ligament Location: Right plantar fascia at the glabrous junction; medial approach. Skin Prep: alcohol Injectate: 1 cc 0.5% marcaine plain, 1 cc kenalog 10. Disposition: Patient tolerated procedure well. Injection site dressed with a band-aid.  Return in about 4 weeks (around 10/25/2022) for Follow-up right heel plantar fasciitis.

## 2022-10-25 ENCOUNTER — Ambulatory Visit (INDEPENDENT_AMBULATORY_CARE_PROVIDER_SITE_OTHER): Payer: Medicare Other | Admitting: Podiatry

## 2022-10-25 DIAGNOSIS — M722 Plantar fascial fibromatosis: Secondary | ICD-10-CM

## 2022-10-25 DIAGNOSIS — M792 Neuralgia and neuritis, unspecified: Secondary | ICD-10-CM | POA: Diagnosis not present

## 2022-10-25 MED ORDER — MELOXICAM 15 MG PO TABS: 15.0000 mg | ORAL_TABLET | Freq: Every day | ORAL | 0 refills | Status: DC

## 2022-10-25 NOTE — Progress Notes (Signed)
  Subjective:  Patient ID: Madison Wheeler, female    DOB: 12-31-1955,  MRN: [id number]  Chief Complaint  Patient presents with   Follow-up    Right heel pain    67 y.o. female presents for follow-up on right heel pain.  She was given a steroid injection at last visit in the right heel plantar fashion's.  Says it gave her about 1 to 2 weeks of relief and then the pain came back.  She says the pain is worse at night and does cause her to stay up at night but she has burning and tingling pain throughout the day as well.  Denies a history of diabetes.  Review of Systems: Negative except as noted in the HPI. Denies N/V/F/Ch.   Objective:  There were no vitals filed for this visit. There is no height or weight on file to calculate BMI. Constitutional Well developed. Well nourished.  Vascular Dorsalis pedis pulses palpable bilaterally. Posterior tibial pulses palpable bilaterally. Capillary refill normal to all digits.  No cyanosis or clubbing noted. Pedal hair growth normal.  Neurologic Normal speech. Oriented to person, place, and time. Epicritic sensation to light touch grossly present bilaterally.  Dermatologic Nails well groomed and normal in appearance. No open wounds. No skin lesions.  Orthopedic: Normal joint ROM without pain or crepitus bilaterally. No visible deformities. Tender to palpation at the calcaneal tuber right. No pain with calcaneal squeeze right. Ankle ROM diminished range of motion right. Silfverskiold Test: negative right.   Radiographs: Taken and reviewed. No acute fractures or dislocations. No evidence of stress fracture.  Plantar heel spur absent. Posterior heel spur absent.   Assessment:   1. Plantar fasciitis of right foot   2. Neuropathic pain     Plan:  Patient was evaluated and treated and all questions answered.  #Plantar Fasciitis, right # Possible neuropathic pain right heel - XR reviewed as above.  - Educated on icing and stretching.  Instructions given.  - Injection delivered to the plantar fascia as below. - DME: Continue power step orthotics - Pharmacologic management: Meloxicam 15 mg take once daily at night for pain and inflammation for the next 30 days dispense 30.. Educated on risks/benefits and proper taking of medication. -I also discussed use of gabapentin 100 mg 3 times daily for possible nerve pain at the right heel  I advised of the risks taking gabapentin including risk of drowsiness. -If no improvement with gabapentin and further steroid injection want to consider further imaging in the future and discuss possible surgical options  Procedure: Injection Tendon/Ligament Location: Right plantar fascia at the glabrous junction; medial approach. Skin Prep: alcohol Injectate: 1 cc 0.5% marcaine plain, 1 cc kenalog 10. Disposition: Patient tolerated procedure well. Injection site dressed with a band-aid.  Return in about 4 weeks (around 11/22/2022).

## 2022-11-22 ENCOUNTER — Ambulatory Visit (INDEPENDENT_AMBULATORY_CARE_PROVIDER_SITE_OTHER): Payer: Medicare Other | Admitting: Podiatry

## 2022-11-22 DIAGNOSIS — M722 Plantar fascial fibromatosis: Secondary | ICD-10-CM | POA: Diagnosis not present

## 2022-11-22 NOTE — Progress Notes (Signed)
  Subjective:  Patient ID: Madison Wheeler, female    DOB: Jan 09, 1956,  MRN: [id number]  Chief Complaint  Patient presents with   follow up visit    Right heel pain has subsided, pain only occurs at night when going to the bathroom    67 y.o. female presents for follow-up on right heel pain.  She was given a second steroid injection at last visit.  She says that her pain is fully resolved at this time with no issues or concerns.  She has a small amount of pain when she gets out of bed in the morning otherwise no pain.  Review of Systems: Negative except as noted in the HPI. Denies N/V/F/Ch.   Objective:  There were no vitals filed for this visit. There is no height or weight on file to calculate BMI. Constitutional Well developed. Well nourished.  Vascular Dorsalis pedis pulses palpable bilaterally. Posterior tibial pulses palpable bilaterally. Capillary refill normal to all digits.  No cyanosis or clubbing noted. Pedal hair growth normal.  Neurologic Normal speech. Oriented to person, place, and time. Epicritic sensation to light touch grossly present bilaterally.  Dermatologic Nails well groomed and normal in appearance. No open wounds. No skin lesions.  Orthopedic: Normal joint ROM without pain or crepitus bilaterally. No visible deformities. Nontender to palpation at the calcaneal tuber right. No pain with calcaneal squeeze right. Ankle ROM diminished range of motion right. Silfverskiold Test: negative right.   Radiographs: Taken and reviewed. No acute fractures or dislocations. No evidence of stress fracture.  Plantar heel spur absent. Posterior heel spur absent.   Assessment:   1. Plantar fasciitis of right foot      Plan:  Patient was evaluated and treated and all questions answered.  #Plantar Fasciitis, right -Much improved after 2 steroid injections to the right heel -Continue with power step orthotics which will aid in preventing recurrence -Continue calf  stretching exercises -Anti-inflammatories as needed  No follow-ups on file.

## 2023-04-06 IMAGING — CR DG CHEST 2V
2 series · 2 of 2 positions shown · non-contrast
Comparison: Radiograph 08/18/2011

CLINICAL DATA: Shortness of breath.  Recent double mastectomy.

EXAM:
CHEST - 2 VIEW

[chest pa]
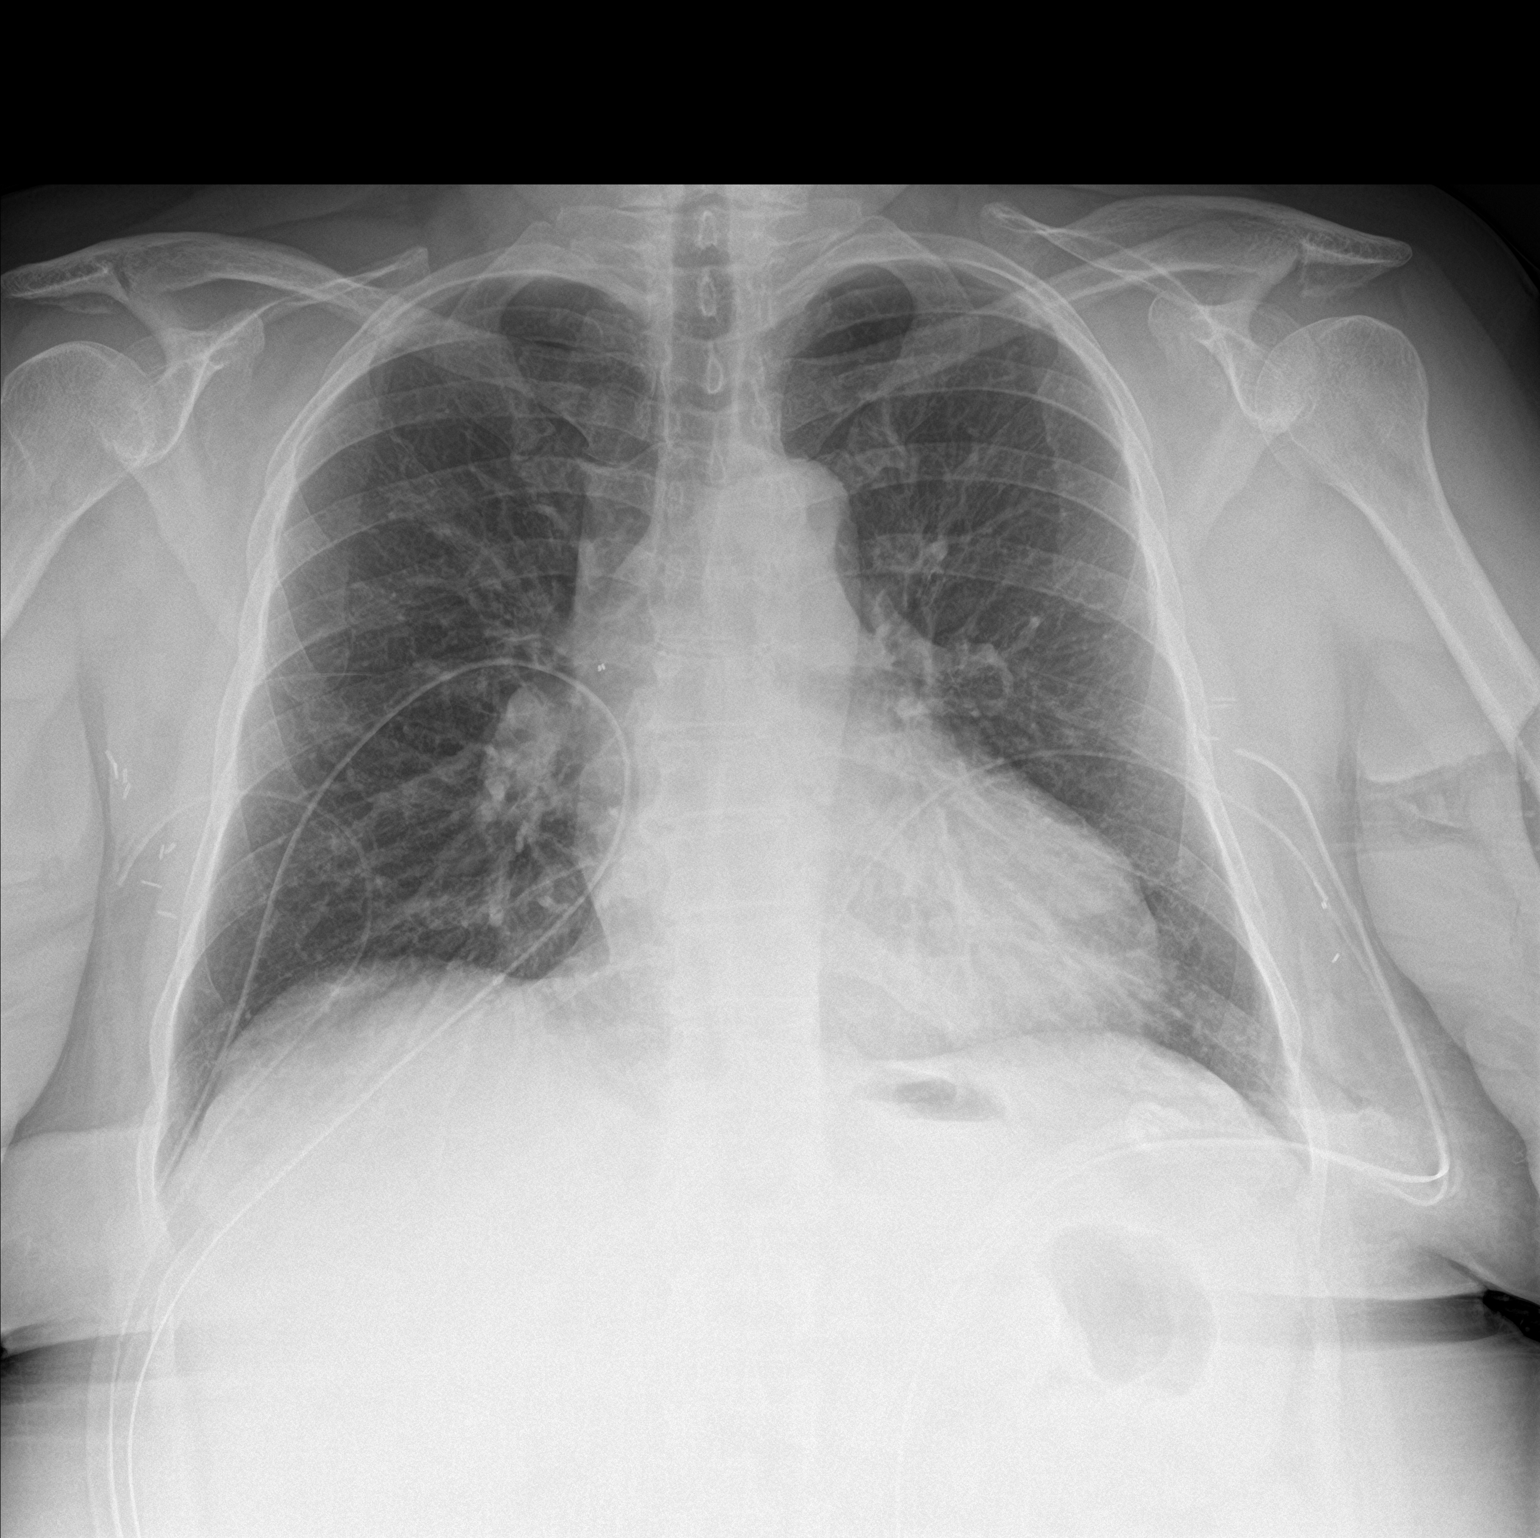

[chest lat]
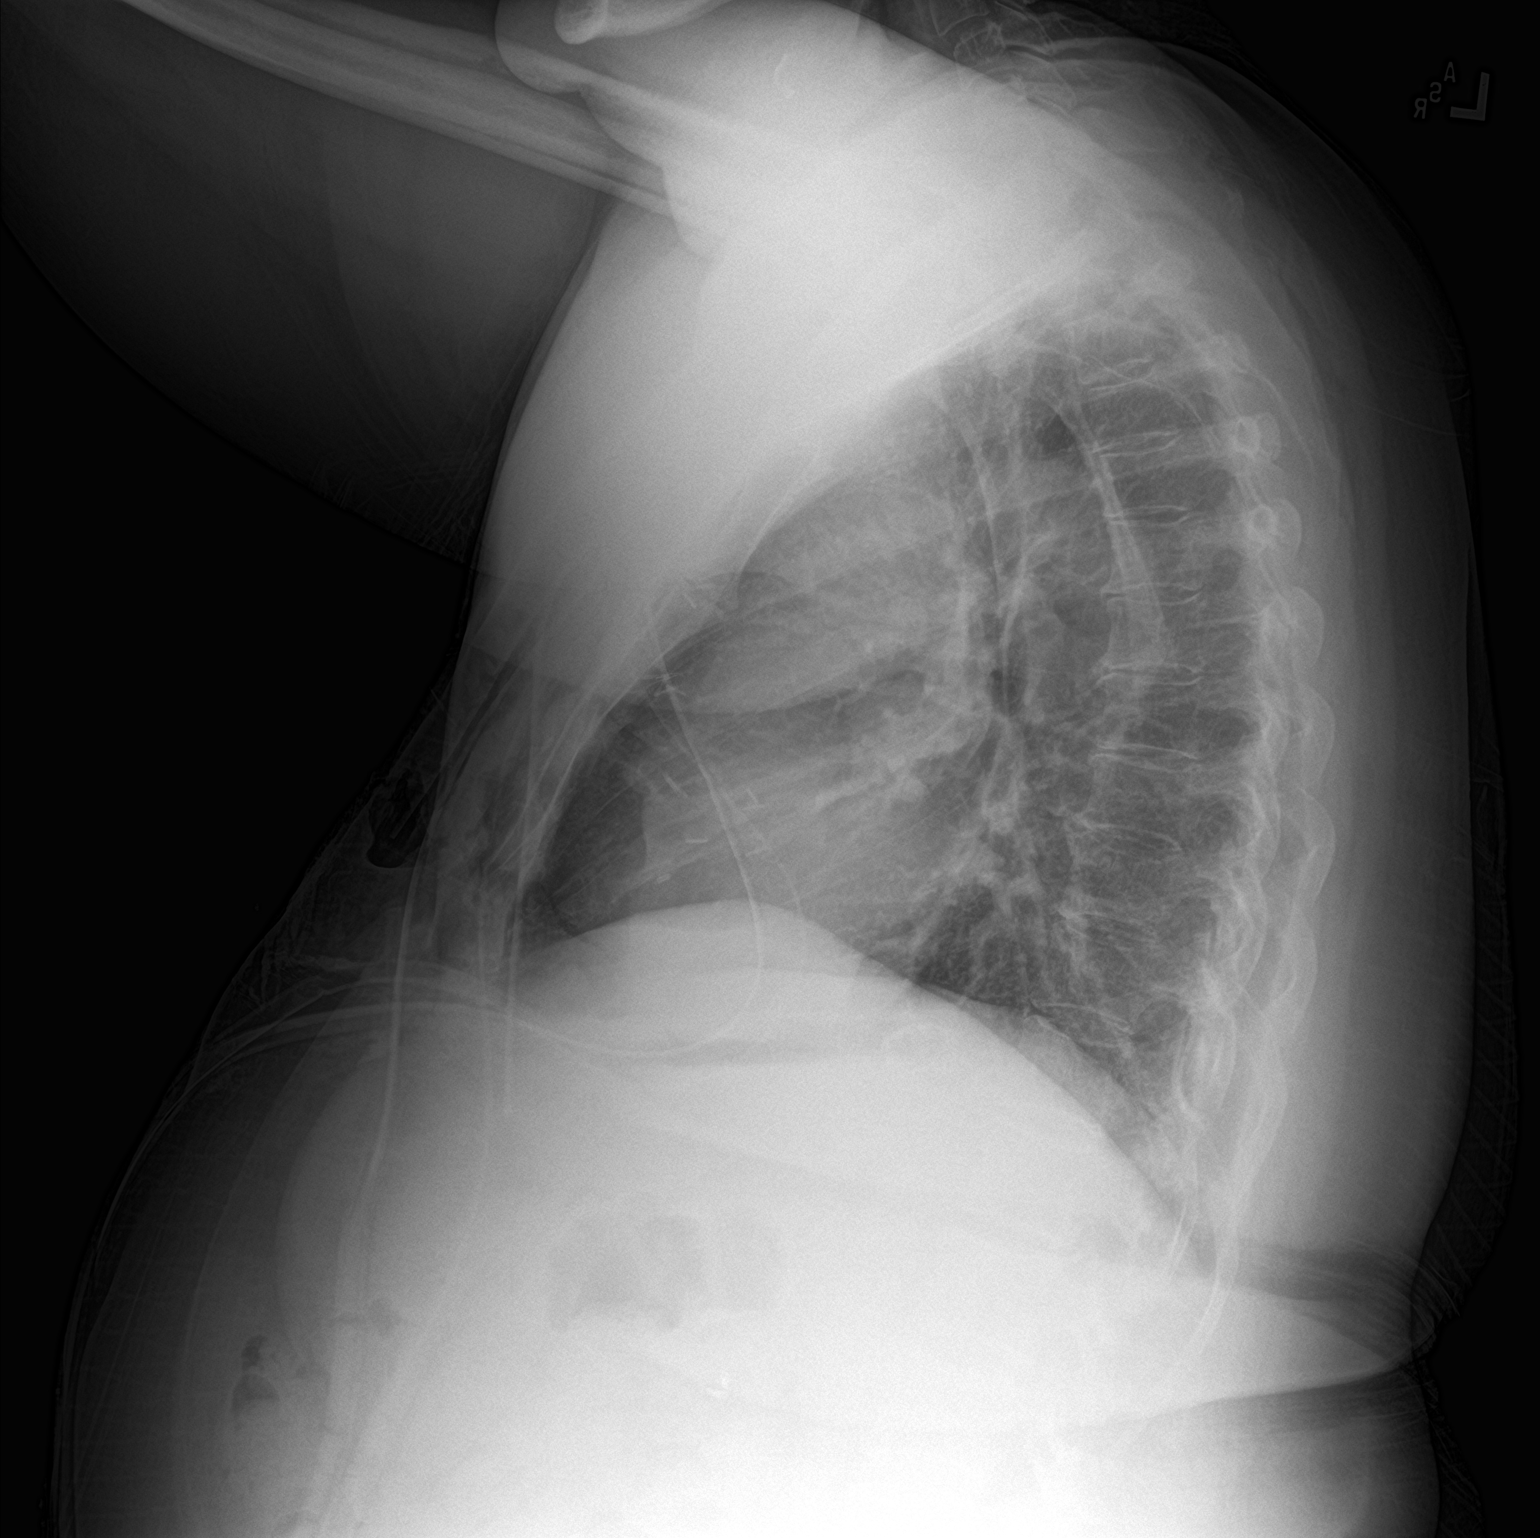

[2 of 2 positions shown; findings below may reference images not displayed]

FINDINGS: The cardiomediastinal contours are normal. The lungs are clear.
Pulmonary vasculature is normal. No consolidation, pleural effusion,
or pneumothorax. No acute osseous abnormalities are seen. Surgical
drains overlie the anterior chest wall. Surgical clips in both
axilla.
IMPRESSION: 1. No acute intrathoracic finding.
2. Surgical drains overlie the anterior chest wall from recent
mastectomy.

## 2023-08-28 ENCOUNTER — Ambulatory Visit: Payer: Medicare Other | Admitting: Oncology

## 2023-08-30 NOTE — Progress Notes (Signed)
[facility]  [address] 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 This was treated with bilateral mastectomies. She did not require any adjuvant therapy and remains without evidence of recurrence at 2 year post op.  Strong family history of malignancy, especially breast cancer. 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  Plan:  Bilateral mastectomies are negative. Her PCP Dr. Shary Decamp does routine labs. Her labs from September, 2024 were all completely normal. I will see her back in 1 year for reevaluation. She understands and agrees with this plan of care. I have answered her questions and she knows to all with any concerns.  I provided 8 minutes of face-to-face time during this this encounter and > 50% was spent counseling as documented under my assessment and plan.   Madison Beckwith, MD [facility] AT [facility] [address] [place] [address] Dept: [phone] Dept Fax: [phone]   CHIEF COMPLAINT:  CC: Stage 0 ductal carcinoma in situ of the right breast  Current Treatment:  Observation  HISTORY OF PRESENT ILLNESS:  Madison Wheeler is a 68 y.o. female referred by Richardson Dopp, NP, for the continued evaluation and management due to her history of breast cancer. This was found on bilateral screening mammogram in January 2022 which revealed right breast calcifications. Diagnostic unilateral right mammogram was pursued in February and confirmed calcifications spanning 0.9 cm and enlarged left axillary lymph nodes. Biopsy was obtained on February 25th and revealed ductal carcinoma in situ. Estrogen receptor was positive at 95% and progesterone receptor was positive at 90%. She underwent  genetic testing due to her strong family history of malignancy, especially breast cancer, which revealed a variant of uncertain significant (VUS) of the NF1 gene. In view of her significant family history the patient opted to undergo bilateral mastectomy. This was pursued as well as sentinel lymph node excision on March 23rd and final pathology from this procedure confirmed ductal carcinoma in situ, high grade, with calcification, 0.8 cm, of the right breast. There was no invasive carcinoma seen. Resection margins were negative. Left breast revealed fibrocystic change, including sclerosing adenosis, usual ductal hyperplasia and calcifications. Negative for carcinoma. One lymph node was negative for malignancy (0/1). She states that she did not do well following her procedure. She developed a seroma on the right which spontaneously opened and also developed an abscess and was treated at [facility] in April accordingly. She gave birth to her 1st child at age 42. She is status post hysterectomy at age 49, but her ovaries are still in tact. She has never received hormone replacement therapy.  I have reviewed her chart and materials related to her cancer extensively and collaborated history with the patient. Summary of oncologic history is as follows: Oncology History  Ductal carcinoma in situ (DCIS) of right breast  10/13/2020 Initial Diagnosis   Screening mammogram showed right breast calcifications. Diagnostic mammogram showed right breast calcifications spanning 0.9cm and enlarged left axillary lymph nodes. 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   10/21/2020 Cancer Staging   Staging form: Breast, AJCC 8th Edition - Clinical stage from 10/21/2020: Stage 0 (cTis (DCIS), cN0, cM0, G3, ER+, PR+, HER2: Not Assessed) - Signed by Pamelia Hoit  Mikey College, MD on 10/21/2020 Stage prefix: Initial diagnosis Laterality: Right Staged by: Pathologist and managing physician Stage used in treatment  planning: Yes National guidelines used in treatment planning: Yes Type of national guideline used in treatment planning: NCCN   11/04/2020 Surgery   Bilateral mastectomies ([facility]):  Right breast: high grade DCIS, 0.8cm clear margins Left breast: fibrocystic changes and no evidence of carcinoma, with one right axillary lymph node negative for carcinoma.    11/11/2020 Genetic Testing   Negative hereditary cancer genetic testing: no pathogenic variants detected in [facility] CancerNext-Expanded +RNAinsight Panel.  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 The report date is November 11, 2020.   The CancerNext-Expanded gene panel offered by [facility] and includes sequencing, rearrangement, and RNA analysis for the following 77 genes: AIP, ALK, APC, ATM, AXIN2, BAP1, BARD1, BLM, BMPR1A, BRCA1, BRCA2, BRIP1, CDC73, CDH1, CDK4, CDKN1B, CDKN2A, CHEK2, CTNNA1, DICER1, FANCC, FH, FLCN, GALNT12, KIF1B, LZTR1, MAX, MEN1, MET, MLH1, MSH2, MSH3, MSH6, MUTYH, NBN, NF1, NF2, NTHL1, PALB2, PHOX2B, PMS2, POT1, PRKAR1A, PTCH1, PTEN, RAD51C, RAD51D, RB1, RECQL, RET, SDHA, SDHAF2, SDHB, SDHC, SDHD, SMAD4, SMARCA4, SMARCB1, SMARCE1, STK11, SUFU, TMEM127, TP53, TSC1, TSC2, VHL and XRCC2 (sequencing and deletion/duplication); EGFR, EGLN1, HOXB13, KIT, MITF, PDGFRA, POLD1, and POLE (sequencing only); EPCAM and GREM1 (deletion/duplication only).      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 Patient states that she feels well and has no complaints of pain. Bilateral mastectomies are negative. Her PCP Dr. Shary Decamp does routine labs. Her labs from September, 2024 were all completely normal. I will see her back in 1 year for reevaluation.   She denies signs of infection such as sore throat, sinus drainage, cough, or urinary symptoms.  She denies fevers or recurrent chills. She denies pain. She denies nausea, vomiting, chest pain,  dyspnea or cough. Her appetite is good and her weight has decreased 3 pounds over last year .  HISTORY:   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   Family history of breast cancer 10/22/2020   Family history of lung cancer 10/22/2020   Family history of malignant neoplasm of breast    Family history of skin cancer 10/22/2020   Hyperlipidemia    Hypertension    Obesity    OSA (obstructive sleep apnea)    uses CPAP nightly   Rosacea    Unspecified otitis media    Vitamin D deficiency     Past Surgical History:  Procedure Laterality Date   ABDOMINAL HYSTERECTOMY     APPENDECTOMY     CESAREAN SECTION     CHOLECYSTECTOMY  2005   SENTINEL NODE BIOPSY Right 11/04/2020   Procedure: RIGHT SENTINEL LYMPH NODE BIOPSY;  Surgeon: Harriette Bouillon, MD;  Location: [facility];  Service: General;  Laterality: Right;   SIMPLE MASTECTOMY WITH AXILLARY SENTINEL NODE BIOPSY Bilateral 11/04/2020   Procedure: BILATERAL SIMPLE MASTECTOMY;  Surgeon: Harriette Bouillon, MD;  Location:  SURGERY CENTER;  Service: General;  Laterality: Bilateral;   TUBAL LIGATION     VESICOVAGINAL FISTULA CLOSURE W/ TAH  1986    Family History  Problem Relation Age of Onset   Breast cancer Sister 51   Breast cancer Maternal Aunt 55   Breast cancer Paternal Grandmother  80   Breast cancer Paternal Aunt 59   Breast cancer Sister 64       identical twin sister   Basal cell carcinoma Sister    Lung cancer Paternal Uncle        dx late 26s   Breast cancer Cousin        dx 45s; maternal cousin   Breast cancer Cousin        early 53s; paternal cousin   Breast cancer Cousin 85       paternal cousin   Breast cancer Maternal Aunt 60   Breast cancer Maternal Aunt 75   Melanoma Father    Colon cancer Other 35       maternal aunt's granddaughter    Breast cancer Cousin        dx 88s; maternal  cousin   Basal cell carcinoma Nephew        onset before age 28    Social History:  reports that she has never smoked. She has never used smokeless tobacco. She reports that she does not drink alcohol and does not use drugs.The patient is accompanied by her husband today. She is married and lives at home with her spouse. She has 2 children. 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  Allergies: No Known Allergies  Current Medications: Current Outpatient Medications  Medication Sig Dispense Refill   diclofenac (VOLTAREN) 75 MG EC tablet Take 1 tablet twice a day by oral route with meal(s).     Ascorbic Acid (VITAMIN C PO) Take 1,000 mg by mouth daily as needed. Only when sick     aspirin 81 MG chewable tablet Chew 1 tablet by mouth daily.     atorvastatin (LIPITOR) 10 MG tablet Take 10 mg by mouth daily.     Calcium Carb-Cholecalciferol [phone] MG-UNIT CAPS Take by mouth.     Cholecalciferol (VITAMIN D-1000 MAX ST) 25 MCG (1000 UT) tablet Take by mouth.     cloNIDine (CATAPRES) 0.2 MG tablet Take 0.2 mg by mouth 2 (two) times daily.     meloxicam (MOBIC) 15 MG tablet Take 1 tablet (15 mg total) by mouth daily. 30 tablet 0   No current facility-administered medications for this visit.    REVIEW OF SYSTEMS:  Review of Systems  Constitutional: Negative.  Negative for appetite change, chills, diaphoresis, fatigue, fever and unexpected weight change.  HENT:  Negative.  Negative for hearing loss, lump/mass, mouth sores, nosebleeds, sore throat, tinnitus, trouble swallowing and voice change.   Eyes: Negative.  Negative for eye problems and icterus.  Respiratory: Negative.  Negative for chest tightness, cough, hemoptysis, shortness of breath and wheezing.   Cardiovascular: Negative.  Negative for chest pain, leg swelling and palpitations.  Gastrointestinal: Negative.  Negative for abdominal  distention, abdominal pain, blood in stool, constipation, diarrhea, nausea, rectal pain and vomiting.  Endocrine: Negative.  Negative for hot flashes.  Genitourinary: Negative.  Negative for bladder incontinence, difficulty urinating, dyspareunia, dysuria, frequency, hematuria, menstrual problem, nocturia, pelvic pain, vaginal bleeding and vaginal discharge.   Musculoskeletal: Negative.  Negative for arthralgias, back pain, flank pain, gait problem, myalgias, neck pain and neck stiffness.  Skin: Negative.  Negative for itching, rash and wound.  Neurological: Negative.  Negative for dizziness, extremity weakness, gait problem, headaches, light-headedness, numbness, seizures and speech difficulty.  Hematological: Negative.  Negative for adenopathy. Does not bruise/bleed easily.  Psychiatric/Behavioral: Negative.  Negative for confusion, decreased concentration, depression, sleep disturbance and suicidal ideas. The patient is not nervous/anxious.       VITALS:  Blood pressure 133/77, pulse 75, temperature 97.7 F (36.5 C), temperature source Oral, resp. rate 16, height 5\' 1"  (1.549 m), weight 222 lb 8 oz (100.9 kg), SpO2 100%.  Wt Readings from Last 3 Encounters:  08/31/23 222 lb 8 oz (100.9 kg)  08/26/22 225 lb 4.8 oz (102.2 kg)  08/26/21 221 lb (100.2 kg)    Body mass index is 42.04 kg/m.  Performance status (ECOG): 0 - Asymptomatic  PHYSICAL EXAM:  Physical Exam Vitals and nursing note reviewed.  Constitutional:      General: She is not in acute distress.    Appearance: Normal appearance. She is normal weight. She is not ill-appearing, toxic-appearing or diaphoretic.  HENT:     Head: Normocephalic and atraumatic.     Right Ear: Tympanic membrane, ear canal and external ear normal. There is no impacted cerumen.     Left Ear: Tympanic membrane, ear canal and external ear normal. There is no impacted cerumen.     Nose: Nose normal. No congestion or rhinorrhea.     Mouth/Throat:      Mouth: Mucous membranes are moist.     Pharynx: Oropharynx is clear. No oropharyngeal exudate or posterior oropharyngeal erythema.  Eyes:     General: No scleral icterus.       Right eye: No discharge.        Left eye: No discharge.     Extraocular Movements: Extraocular movements intact.     Conjunctiva/sclera: Conjunctivae normal.     Pupils: Pupils are equal, round, and reactive to light.  Neck:     Vascular: No carotid bruit.  Cardiovascular:     Rate and Rhythm: Normal rate and regular rhythm.     Pulses: Normal pulses.     Heart sounds: Normal heart sounds. No murmur heard.    No friction rub. No gallop.  Pulmonary:     Effort: Pulmonary effort is normal. No respiratory distress.     Breath sounds: Normal breath sounds. No stridor. No wheezing, rhonchi or rales.  Chest:     Chest wall: No tenderness.  Breasts:    Right: Absent.     Left: Absent.     Comments: Bilateral mastectomies are negative.   Abdominal:     General: Bowel sounds are normal. There is no distension.     Palpations: Abdomen is soft. There is no hepatomegaly, splenomegaly or mass.     Tenderness: There is no abdominal tenderness. There is no right CVA tenderness, left CVA tenderness, guarding or rebound.     Hernia: No hernia is present.  Musculoskeletal:        General: No swelling, tenderness, deformity or signs of injury. Normal range of motion.     Cervical back: Normal range of motion and neck supple. No rigidity or tenderness.     Right lower leg: No edema.     Left lower leg: No edema.  Lymphadenopathy:     Cervical: No cervical adenopathy.  Skin:    General: Skin is warm and dry.     Coloration: Skin is not jaundiced or pale.     Findings: No bruising, erythema, lesion or rash.  Neurological:     General: No focal deficit present.     Mental Status: She is alert and oriented to person, place, and time. Mental status is at baseline.  Cranial Nerves: No cranial nerve deficit.     Sensory:  No sensory deficit.     Motor: No weakness.     Coordination: Coordination normal.     Gait: Gait normal.     Deep Tendon Reflexes: Reflexes normal.  Psychiatric:        Mood and Affect: Mood normal.        Behavior: Behavior normal.        Thought Content: Thought content normal.        Judgment: Judgment normal.      LABS:      Latest Ref Rng & Units 11/20/2020    2:52 PM 10/21/2020   12:10 PM  CBC  WBC 4.0 - 10.5 K/uL 6.3  8.0   Hemoglobin 12.0 - 15.0 g/dL 9.7  16.1   Hematocrit 36.0 - 46.0 % 30.4  40.8   Platelets 150 - 400 K/uL 400  315       Latest Ref Rng & Units 11/20/2020    2:52 PM 10/21/2020   12:10 PM  CMP  Glucose 70 - 99 mg/dL 096  045   BUN 8 - 23 mg/dL 9  11   Creatinine 4.09 - 1.00 mg/dL 8.11  9.14   Sodium 782 - 145 mmol/L 140  140   Potassium 3.5 - 5.1 mmol/L 3.1  4.0   Chloride 98 - 111 mmol/L 108  107   CO2 22 - 32 mmol/L 23  23   Calcium 8.9 - 10.3 mg/dL 8.8  9.3   Total Protein 6.5 - 8.1 g/dL 6.0  7.4   Total Bilirubin 0.3 - 1.2 mg/dL 0.4  0.5   Alkaline Phos 38 - 126 U/L 118  137   AST 15 - 41 U/L 21  19   ALT 0 - 44 U/L 20  25     STUDIES:  No results found.     I,Jasmine M Lassiter,acting as a scribe for Madison Beckwith, MD.,have documented all relevant documentation on the behalf of Madison Beckwith, MD,as directed by  Madison Beckwith, MD while in the presence of Madison Beckwith, MD.

## 2023-08-31 ENCOUNTER — Encounter: Payer: Self-pay | Admitting: Oncology

## 2023-08-31 ENCOUNTER — Inpatient Hospital Stay: Payer: Medicare Other | Attending: Oncology | Admitting: Oncology

## 2023-08-31 ENCOUNTER — Telehealth: Payer: Self-pay | Admitting: Oncology

## 2023-08-31 VITALS — BP 133/77 | HR 75 | Temp 97.7°F | Resp 16 | Ht 61.0 in | Wt 222.5 lb

## 2023-08-31 DIAGNOSIS — D0511 Intraductal carcinoma in situ of right breast: Secondary | ICD-10-CM

## 2023-08-31 DIAGNOSIS — Z9013 Acquired absence of bilateral breasts and nipples: Secondary | ICD-10-CM | POA: Diagnosis not present

## 2023-08-31 DIAGNOSIS — Z86000 Personal history of in-situ neoplasm of breast: Secondary | ICD-10-CM | POA: Insufficient documentation

## 2023-08-31 NOTE — Telephone Encounter (Signed)
08/31/23 Next appt scheduled and confirmed with patient

## 2023-09-27 ENCOUNTER — Other Ambulatory Visit (HOSPITAL_BASED_OUTPATIENT_CLINIC_OR_DEPARTMENT_OTHER): Payer: Self-pay | Admitting: Specialist

## 2023-09-27 DIAGNOSIS — R200 Anesthesia of skin: Secondary | ICD-10-CM

## 2023-09-27 DIAGNOSIS — R202 Paresthesia of skin: Secondary | ICD-10-CM

## 2023-09-28 ENCOUNTER — Ambulatory Visit (INDEPENDENT_AMBULATORY_CARE_PROVIDER_SITE_OTHER)
Admission: RE | Admit: 2023-09-28 | Discharge: 2023-09-28 | Disposition: A | Discharge status: Home / Self Care | Payer: Medicare Other | Source: Ambulatory Visit | Attending: Specialist | Admitting: Specialist

## 2023-09-28 DIAGNOSIS — R200 Anesthesia of skin: Secondary | ICD-10-CM

## 2023-09-28 DIAGNOSIS — R202 Paresthesia of skin: Secondary | ICD-10-CM

## 2023-09-28 MED ORDER — GADOBUTROL 1 MMOL/ML IV SOLN
10.0000 mL | Freq: Once | INTRAVENOUS | Status: AC | PRN
  Administered 2023-09-28: 10 mL via INTRAVENOUS

## 2023-10-03 ENCOUNTER — Other Ambulatory Visit (HOSPITAL_BASED_OUTPATIENT_CLINIC_OR_DEPARTMENT_OTHER): Payer: Self-pay | Admitting: Specialist

## 2023-10-03 DIAGNOSIS — R202 Paresthesia of skin: Secondary | ICD-10-CM

## 2023-10-03 DIAGNOSIS — R200 Anesthesia of skin: Secondary | ICD-10-CM

## 2023-10-12 ENCOUNTER — Ambulatory Visit (INDEPENDENT_AMBULATORY_CARE_PROVIDER_SITE_OTHER)
Admission: RE | Admit: 2023-10-12 | Discharge: 2023-10-12 | Disposition: A | Discharge status: Home / Self Care | Payer: Medicare Other | Source: Ambulatory Visit | Attending: Specialist | Admitting: Specialist

## 2023-10-12 ENCOUNTER — Ambulatory Visit (HOSPITAL_BASED_OUTPATIENT_CLINIC_OR_DEPARTMENT_OTHER)
Admission: RE | Admit: 2023-10-12 | Discharge: 2023-10-12 | Disposition: A | Discharge status: Home / Self Care | Payer: Medicare Other | Source: Ambulatory Visit | Attending: Specialist | Admitting: Specialist

## 2023-10-12 DIAGNOSIS — R200 Anesthesia of skin: Secondary | ICD-10-CM

## 2023-10-12 DIAGNOSIS — R202 Paresthesia of skin: Secondary | ICD-10-CM

## 2024-01-09 ENCOUNTER — Encounter: Payer: Self-pay | Admitting: Neurology

## 2024-03-05 ENCOUNTER — Encounter: Payer: Self-pay | Admitting: Neurology

## 2024-03-05 ENCOUNTER — Ambulatory Visit (INDEPENDENT_AMBULATORY_CARE_PROVIDER_SITE_OTHER): Admitting: Neurology

## 2024-03-05 VITALS — BP 157/83 | HR 81 | Resp 20 | Ht 61.0 in | Wt 210.0 lb

## 2024-03-05 DIAGNOSIS — R202 Paresthesia of skin: Secondary | ICD-10-CM

## 2024-03-05 MED ORDER — GABAPENTIN 300 MG PO CAPS: 300.0000 mg | ORAL_CAPSULE | Freq: Every day | ORAL | 1 refills | Status: DC

## 2024-03-05 NOTE — Patient Instructions (Signed)
 Nerve testing of the left arm and leg  Start gabapentin  300mg  at bedtime  ELECTROMYOGRAM AND NERVE CONDUCTION STUDIES (EMG/NCS) INSTRUCTIONS  How to Prepare The neurologist conducting the EMG will need to know if you have certain medical conditions. Tell the neurologist and other EMG lab personnel if you: Have a pacemaker or any other electrical medical device Take blood-thinning medications Have hemophilia, a blood-clotting disorder that causes prolonged bleeding Bathing Take a shower or bath shortly before your exam in order to remove oils from your skin. Don't apply lotions or creams before the exam.  What to Expect You'll likely be asked to change into a hospital gown for the procedure and lie down on an examination table. The following explanations can help you understand what will happen during the exam.  Electrodes. The neurologist or a technician places surface electrodes at various locations on your skin depending on where you're experiencing symptoms. Or the neurologist may insert needle electrodes at different sites depending on your symptoms.  Sensations. The electrodes will at times transmit a tiny electrical current that you may feel as a twinge or spasm. The needle electrode may cause discomfort or pain that usually ends shortly after the needle is removed. If you are concerned about discomfort or pain, you may want to talk to the neurologist about taking a short break during the exam.  Instructions. During the needle EMG, the neurologist will assess whether there is any spontaneous electrical activity when the muscle is at rest - activity that isn't present in healthy muscle tissue - and the degree of activity when you slightly contract the muscle.  He or she will give you instructions on resting and contracting a muscle at appropriate times. Depending on what muscles and nerves the neurologist is examining, he or she may ask you to change positions during the exam.  After your  EMG You may experience some temporary, minor bruising where the needle electrode was inserted into your muscle. This bruising should fade within several days. If it persists, contact your primary care doctor.

## 2024-03-05 NOTE — Progress Notes (Signed)
 [facility] Clinic Note - Initial Visit   Date: 03/05/2024   Madison [place] MRN: [id number] DOB: [birth year]   Dear Dr. Burnetta:  Thank you for your kind referral of Madison [place] for consultation of left side numbness/tingling. Although her history is well known to you, please allow us  to reiterate it for the purpose of our medical record. The patient was accompanied to the clinic by self.    Madison [place] is a 68 y.o. right-handed female with hypertension, hyperlipidemia, and OSA presenting for evaluation of left side numbness/tingling.   IMPRESSION/PLAN: Left arm and leg paresthesias, worse at rest and improved with activity.  Symptoms are suggestive of restless leg syndrome.  However, she also reports left side weakness, which is not associated with RLS.  Imaging of the neuroaxis does not show structural pathology to explain her symptoms.  Vitamin B12, TSH, and ferritin is normal.   I recommend NCS/EMG of the left side to further evaluate symptoms.   Start gabapentin  300mg  at bedtime. If NCS/EMG is normal, may consider switching to dopamine agonist therapy.   Return to clinic in 3-4 months  ------------------------------------------------------------- History of present illness: Starting in January, she developed acute onset of numbness/tingling involving the entire left arm and leg which occurs at rest or laying down at bedtime.  She has relief with walking around.  She was given gabapentin  300mg  at bedtime which provided some relief.  She also feels that there is weakness on the left side. No neck pain or shooting arm/leg pain.  Nonsmoker.  She does not drink alcohol.  She previously worked as Charity fundraiser in [employer], but after her husband suffered hemorrhagic stroke she is primary caregiver to him.    Out-side paper records, electronic medical record, and images have been reviewed where available and summarized as:  MRI brain wwo contrast 09/28/2023: Unremarkable MRI  appearance of the brain for age. No evidence of an acute intracranial abnormality.  MRI cervical spine wo contrast 10/12/2023: Mild cervical spondylosis without compressive stenosis. Normal appearance of the cervical spinal cord.  MRI thoracic spine wo contrast 10/12/2023: Mild thoracic spondylosis without stenosis. Normal appearance of the thoracic spinal cord.  MRI lumbar spine wo contrast 10/12/2023: Progressive severe facet arthrosis at L4-5 with grade 1 anterolisthesis. No compressive stenosis.     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   Family history of breast cancer 10/22/2020   Family history of lung cancer 10/22/2020   Family history of malignant neoplasm of breast    Family history of skin cancer 10/22/2020   Hyperlipidemia    Hypertension    Obesity    OSA (obstructive sleep apnea)    uses CPAP nightly   Rosacea    Unspecified otitis media    Vitamin D deficiency     Past Surgical History:  Procedure Laterality Date   ABDOMINAL HYSTERECTOMY     APPENDECTOMY     CESAREAN SECTION     CHOLECYSTECTOMY  2005   SENTINEL NODE BIOPSY Right 11/04/2020   Procedure: RIGHT SENTINEL LYMPH NODE BIOPSY;  Surgeon: Vanderbilt Ned, MD;  Location: [facility];  Service: General;  Laterality: Right;   SIMPLE MASTECTOMY WITH AXILLARY SENTINEL NODE BIOPSY Bilateral 11/04/2020   Procedure: BILATERAL SIMPLE MASTECTOMY;  Surgeon: Vanderbilt Ned, MD;  Location: [facility];  Service: General;  Laterality: Bilateral;   TUBAL LIGATION     VESICOVAGINAL FISTULA CLOSURE W/ TAH  1986     Medications:  Outpatient Encounter Medications as of 03/05/2024  Medication Sig   Ascorbic Acid (VITAMIN C PO) Take 1,000 mg by mouth daily as needed. Only when sick   atorvastatin (LIPITOR) 10 MG tablet Take 10 mg by mouth daily.   Calcium Carb-Cholecalciferol  [phone] MG-UNIT CAPS Take by mouth.   Cholecalciferol (VITAMIN D-1000 MAX ST) 25 MCG (1000 UT) tablet Take by mouth.   cloNIDine  (CATAPRES ) 0.2 MG tablet Take 0.2 mg by mouth 2 (two) times daily.   diclofenac (VOLTAREN) 75 MG EC tablet Take 1 tablet twice a day by oral route with meal(s).   aspirin 81 MG chewable tablet Chew 1 tablet by mouth daily.   [DISCONTINUED] meloxicam  (MOBIC ) 15 MG tablet Take 1 tablet (15 mg total) by mouth daily.   No facility-administered encounter medications on file as of 03/05/2024.    Allergies: No Known Allergies  Family History: Family History  Problem Relation Age of Onset   Breast cancer Sister 38   Breast cancer Maternal Aunt 84   Breast cancer Paternal Grandmother 40   Breast cancer Paternal Aunt 81   Breast cancer Sister 45       identical twin sister   Basal cell carcinoma Sister    Lung cancer Paternal Uncle        dx late 48s   Breast cancer Cousin        dx 24s; maternal cousin   Breast cancer Cousin        early 70s; paternal cousin   Breast cancer Cousin 50       paternal cousin   Breast cancer Maternal Aunt 60   Breast cancer Maternal Aunt 44   Melanoma Father    Colon cancer Other 35       maternal aunt's granddaughter    Breast cancer Cousin        dx 90s; maternal cousin   Basal cell carcinoma Nephew        onset before age 3    Social History: Social History   Tobacco Use   Smoking status: Never   Smokeless tobacco: Never  Substance Use Topics   Alcohol use: No   Drug use: Never   Social History   Social History Narrative   Not on file    Vital Signs:  BP (!) 157/83   Pulse 81   Resp 20   Ht 5' 1 (1.549 m)   Wt 210 lb (95.3 kg)   SpO2 97%   BMI 39.68 kg/m    Neurological Exam: MENTAL STATUS including orientation to time, place, person, recent and remote memory, attention span and concentration, language, and fund of knowledge is normal.  Speech is not dysarthric.  CRANIAL NERVES: II:  No visual  field defects.     III-IV-VI: Pupils equal round and reactive to light.  Normal conjugate, extra-ocular eye movements in all directions of gaze.  No nystagmus.  No ptosis.   V:  Normal facial sensation.    VII:  Normal facial symmetry and movements.   VIII:  Normal hearing and vestibular function.   IX-X:  Normal palatal movement.   XI:  Normal shoulder shrug and head rotation.   XII:  Normal tongue strength and range of motion, no deviation or fasciculation.  MOTOR:  No atrophy, fasciculations or abnormal movements.  No pronator drift.   Upper Extremity:  Right  Left  Deltoid  5/5   5-/5   Biceps  5/5   5/5   Triceps  5/5   5/5   Wrist extensors  5/5   5/5   Wrist flexors  5/5   5/5   Finger extensors  5/5   5/5   Finger flexors  5/5   5/5   Dorsal interossei  5/5   5/5   Abductor pollicis  5/5   5/5   Tone (Ashworth scale)  0  0   Lower Extremity:  Right  Left  Hip flexors  5/5   5-/5   Hip extensors  5/5   5/5   Adductor 5/5  5/5  Abductor 5/5  5/5  Knee flexors  5/5   5/5   Knee extensors  5/5   5/5   Dorsiflexors  5/5   5/5   Plantarflexors  5/5   5/5   Toe extensors  5/5   5/5   Toe flexors  5/5   5/5   Tone (Ashworth scale)  0  0   MSRs:                                           Right        Left brachioradialis 2+  2+  biceps 2+  2+  triceps 2+  2+  patellar 2+  2+  ankle jerk 2+  2+  Hoffman no  no  plantar response down  down   SENSORY:  Normal and symmetric perception of light touch, pinprick, vibration, and temperature.  Romberg's sign absent.   COORDINATION/GAIT: Normal finger-to- nose-finger.  Intact rapid alternating movements bilaterally.  Able to rise from a chair without using arms.  Gait narrow based and stable. Tandem and stressed gait intact.     Thank you for allowing me to participate in patient's care.  If I can answer any additional questions, I would be pleased to do so.    Sincerely,    Jadeyn Hargett K. Tobie, DO

## 2024-04-18 ENCOUNTER — Ambulatory Visit (INDEPENDENT_AMBULATORY_CARE_PROVIDER_SITE_OTHER): Admitting: Neurology

## 2024-04-18 DIAGNOSIS — G5602 Carpal tunnel syndrome, left upper limb: Secondary | ICD-10-CM

## 2024-04-18 DIAGNOSIS — R202 Paresthesia of skin: Secondary | ICD-10-CM

## 2024-04-18 NOTE — Procedures (Signed)
 [facility]  [address] [place], [address]  [place], [place] [address] Tel: [phone] Fax: [phone] Test Date:  04/18/2024  Patient: Madison [place] DOB: August 15, 1956 Physician: Tonita Blanch, DO  Sex: Female Height: 5' 1 Ref Phys: Tonita Blanch, DO  ID#: [id number]   Technician:    History: This is a 68 year old female referred for evaluation of generalized paresthesias.  NCV & EMG Findings: Extensive electrodiagnostic testing of the left upper and lower extremity shows:  Left mixed palmar sensory responses show prolonged latency.  Left median, ulnar, sural, and superficial peroneal sensory responses are within normal limits. Left median, ulnar, and peroneal motor responses are within normal limits.  Left tibial motor response shows reduced amplitude.  In isolation, these findings are of uncertain clinical significance. Left tibial H reflex study is within normal limits. There is no evidence of active or chronic motor axonal loss changes affecting any of the tested muscles.  Motor unit configuration and recruitment pattern is within normal limits.  Impression: Left median neuropathy at or distal to the wrist, consistent with a clinical diagnosis of carpal tunnel syndrome.  Overall, these findings are mild in degree electrically. There is no evidence of a generalized sensorimotor polyneuropathy or cervical/lumbosacral radiculopathy affecting the left side.   ___________________________ Tonita Blanch, DO    Nerve Conduction Studies   Stim Site NR Peak (ms) Norm Peak (ms) O-P Amp (V) Norm O-P Amp  Left Median Anti Sensory (2nd Digit)  32 C  Wrist    2.9 <3.8 33.1 >10  Left Sup Peroneal Anti Sensory (Ant Lat Mall)  32 C  12 cm    1.8 <4.6 10.1 >3  Left Sural Anti Sensory (Lat Mall)  32 C  Calf    2.6 <4.6 14.0 >3  Left Ulnar Anti Sensory (5th Digit)  32 C  Wrist    2.8 <3.2 29.9 >5     Stim Site NR Onset (ms) Norm Onset (ms) O-P Amp (mV) Norm O-P Amp Site1 Site2  Delta-0 (ms) Dist (cm) Vel (m/s) Norm Vel (m/s)  Left Median Motor (Abd Poll Brev)  32 C  Wrist    2.7 <4.0 9.2 >5 Elbow Wrist 4.7 28.0 60 >50  Elbow    7.4  8.8         Left Peroneal Motor (Ext Dig Brev)  32 C  Ankle    2.5 <6.0 3.6 >2.5 B Fib Ankle 6.8 34.0 50 >40  B Fib    9.3  3.9  Poplt B Fib 1.4 8.0 57 >40  Poplt    10.7  3.8         Left Tibial Motor (Abd Hall Brev)  32 C  Ankle    3.0 <6.0 *2.0 >4 Knee Ankle 7.5 40.0 53 >40  Knee    10.5  2.1         Left Ulnar Motor (Abd Dig Minimi)  32 C  Wrist    2.3 <3.1 10.3 >7 B Elbow Wrist 3.1 21.0 68 >50  B Elbow    5.4  10.0  A Elbow B Elbow 1.5 10.0 67 >50  A Elbow    6.9  9.5            Stim Site NR Peak (ms) Norm Peak (ms) P-T Amp (V) Site1 Site2 Delta-P (ms) Norm Delta (ms)  Left Median/Ulnar Palm Comparison (Wrist - 8cm)  32 C  Median Palm    2.0 <2.2 55.2 Median Palm Ulnar Palm *0.7  Ulnar Palm    1.3 <2.2 9.0       Electromyography   Side Muscle Ins.Act Fibs Fasc Recrt Amp Dur Poly Activation Comment  Left 1stDorInt Nml Nml Nml Nml Nml Nml Nml Nml N/A  Left Abd Poll Brev Nml Nml Nml Nml Nml Nml Nml Nml N/A  Left PronatorTeres Nml Nml Nml Nml Nml Nml Nml Nml N/A  Left Biceps Nml Nml Nml Nml Nml Nml Nml Nml N/A  Left Triceps Nml Nml Nml Nml Nml Nml Nml Nml N/A  Left Deltoid Nml Nml Nml Nml Nml Nml Nml Nml N/A  Left AntTibialis Nml Nml Nml Nml Nml Nml Nml Nml N/A  Left Gastroc Nml Nml Nml Nml Nml Nml Nml Nml N/A  Left Flex Dig Long Nml Nml Nml Nml Nml Nml Nml Nml N/A  Left BicepsFemS Nml Nml Nml Nml Nml Nml Nml Nml N/A  Left RectFemoris Nml Nml Nml Nml Nml Nml Nml Nml N/A      Waveforms:

## 2024-04-19 ENCOUNTER — Ambulatory Visit: Payer: Self-pay | Admitting: Neurology

## 2024-05-03 ENCOUNTER — Telehealth: Payer: Self-pay | Admitting: Neurology

## 2024-05-03 MED ORDER — GABAPENTIN 300 MG PO CAPS: 600.0000 mg | ORAL_CAPSULE | Freq: Every day | ORAL | 1 refills | Status: AC

## 2024-05-03 NOTE — Telephone Encounter (Signed)
 Called patient and informed her of Dr. Anthony response below. Patient is aware someone will give her a call to change her follow up to March. Patient verbalized understanding and had no further questions or concerns.

## 2024-05-03 NOTE — Telephone Encounter (Signed)
 Please let pt know that I am glad it is helped and updated prescription has been sent.  OK to move her follow-up appt to 6 months (March).

## 2024-05-03 NOTE — Telephone Encounter (Signed)
 Patient called to let Dr.Patel know the increased gabapentin  is wokring well. She is taking 600mg  now. She would like a prescription sent to her pharmacy on file. She has an appt in Oct-does she need that appt now

## 2024-05-22 ENCOUNTER — Ambulatory Visit: Admitting: Neurology

## 2024-08-30 ENCOUNTER — Telehealth: Payer: Self-pay | Admitting: Hematology and Oncology

## 2024-08-30 ENCOUNTER — Encounter: Payer: Self-pay | Admitting: Hematology and Oncology

## 2024-08-30 ENCOUNTER — Inpatient Hospital Stay: Payer: Medicare Other | Attending: Hematology and Oncology | Admitting: Hematology and Oncology

## 2024-08-30 ENCOUNTER — Other Ambulatory Visit: Payer: Self-pay

## 2024-08-30 VITALS — BP 149/82 | HR 74 | Temp 98.0°F | Resp 16 | Ht 62.0 in | Wt 201.4 lb

## 2024-08-30 DIAGNOSIS — D0511 Intraductal carcinoma in situ of right breast: Secondary | ICD-10-CM

## 2024-08-30 DIAGNOSIS — Z803 Family history of malignant neoplasm of breast: Secondary | ICD-10-CM

## 2024-08-30 NOTE — Assessment & Plan Note (Addendum)
 Strong family history of breast cancer.  Testing for hereditary cancer syndromes with the [facility] CancerNext-Expanded +RNAinsight Panel in March 2022 did not reveal any pathogenic variants..  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

## 2024-08-30 NOTE — Telephone Encounter (Signed)
 Patient has been scheduled for follow-up visit per 08/30/2024 LOS.  Pt given an appt calendar with date and time.

## 2024-08-30 NOTE — Progress Notes (Signed)
 " [facility]  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  The patient opted for bilateral mastectomy.  She remains without evidence of disease.  We will plan to see her back in 1 year for repeat examination.  Family history of breast cancer Strong family history of breast cancer.  Testing for hereditary cancer syndromes with the [facility] CancerNext-Expanded +RNAinsight Panel in March 2022 did not reveal any pathogenic variants..  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    The patient understands the plans discussed today and is in agreement with them.  She knows to contact our office if she develops concerns prior to her next appointment.   I provided 15 minutes of face-to-face time during this encounter and > 50% was spent counseling as documented under my assessment and plan.    Malikah Lakey A Idonna Heeren, PA-C  [facility]  - A DEPT OF [facility] [address] [place] [address] Dept: [phone] Dept Fax: [phone]   No orders of the defined types were placed in this encounter.     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 Diagnostic mammogram showed right breast calcifications spanning 0.9cm and enlarged left axillary lymph nodes. 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   10/21/2020 Cancer Staging   Staging form: Breast,  AJCC 8th Edition - Clinical stage from 10/21/2020: Stage 0 (cTis (DCIS), cN0, cM0, G3, ER+, PR+, HER2: Not Assessed) - Signed by Odean Potts, MD on 10/21/2020 Stage prefix: Initial diagnosis Laterality: Right Staged by: Pathologist and managing physician Stage used in treatment planning: Yes National guidelines used in treatment planning: Yes Type of national guideline used in treatment planning: NCCN   11/04/2020 Surgery   Bilateral mastectomies ([facility]):  Right breast: high grade DCIS, 0.8cm clear margins Left breast: fibrocystic changes and no evidence of carcinoma, with one right axillary lymph node negative for carcinoma.    11/11/2020 Genetic Testing   Negative hereditary cancer genetic testing: no pathogenic variants detected in [facility] CancerNext-Expanded +RNAinsight Panel.  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 The report date is November 11, 2020.   The CancerNext-Expanded gene panel offered by [facility] and includes sequencing, rearrangement, and RNA analysis for the following 77 genes: AIP, ALK, APC, ATM, AXIN2, BAP1, BARD1, BLM, BMPR1A, BRCA1, BRCA2, BRIP1, CDC73, CDH1, CDK4, CDKN1B, CDKN2A, CHEK2, CTNNA1, DICER1, FANCC, FH, FLCN, GALNT12, KIF1B, LZTR1, MAX, MEN1, MET, MLH1, MSH2, MSH3, MSH6, MUTYH, NBN, NF1, NF2, NTHL1, PALB2, PHOX2B, PMS2, POT1, PRKAR1A, PTCH1, PTEN, RAD51C, RAD51D, RB1, RECQL, RET, SDHA, SDHAF2, SDHB, SDHC, SDHD, SMAD4, SMARCA4, SMARCB1, SMARCE1, STK11, SUFU, TMEM127, TP53, TSC1, TSC2, VHL and XRCC2 (sequencing and deletion/duplication); EGFR, EGLN1, HOXB13, KIT, MITF, PDGFRA, POLD1, and POLE (sequencing only); EPCAM and GREM1 (deletion/duplication only).        INTERVAL HISTORY:   Madison [place] is here today for annual follow-up.  Since  her last visit, she has been doing well.  She denies any changes in her mastectomy sites.  She denies fevers or chills. She denies pain. Her appetite is good. Her weight has decreased 9 pounds over  last year.  REVIEW OF SYSTEMS:   Review of Systems  Constitutional:  Negative for appetite change, chills, fatigue, fever and unexpected weight change.  HENT:   Negative for lump/mass, mouth sores and sore throat.   Respiratory:  Negative for cough and shortness of breath.   Cardiovascular:  Negative for chest pain and leg swelling.  Gastrointestinal:  Negative for abdominal pain, constipation, diarrhea, nausea and vomiting.  Endocrine: Negative for hot flashes.  Genitourinary:  Negative for difficulty urinating, dysuria, frequency, hematuria and vaginal bleeding.   Musculoskeletal:  Negative for arthralgias, back pain, gait problem, myalgias and neck pain.  Skin:  Negative for rash.  Neurological:  Positive for numbness. Negative for dizziness, extremity weakness, gait problem, headaches and light-headedness.  Hematological:  Negative for adenopathy. Does not bruise/bleed easily.  Psychiatric/Behavioral:  Negative for depression and sleep disturbance. The patient is not nervous/anxious.      VITALS:   Blood pressure (!) 149/82, pulse 74, temperature 98 F (36.7 C), temperature source Oral, resp. rate 16, height 5' 2 (1.575 m), weight 201 lb 6.4 oz (91.4 kg), SpO2 100%.  Wt Readings from Last 3 Encounters:  08/30/24 201 lb 6.4 oz (91.4 kg)  03/05/24 210 lb (95.3 kg)  08/31/23 222 lb 8 oz (100.9 kg)    Body mass index is 36.84 kg/m.  Performance status (ECOG): 0 - Asymptomatic    PHYSICAL EXAM:   Physical Exam Vitals and nursing note reviewed.  Constitutional:      General: She is not in acute distress.    Appearance: Normal appearance.  HENT:     Head: Normocephalic and atraumatic.     Mouth/Throat:     Mouth: Mucous membranes are moist.     Pharynx: Oropharynx is clear. No oropharyngeal exudate or posterior oropharyngeal erythema.  Eyes:     General: No scleral icterus.    Extraocular Movements: Extraocular movements intact.     Conjunctiva/sclera: Conjunctivae  normal.     Pupils: Pupils are equal, round, and reactive to light.  Cardiovascular:     Rate and Rhythm: Normal rate and regular rhythm.     Heart sounds: Normal heart sounds. No murmur heard.    No friction rub. No gallop.  Pulmonary:     Effort: Pulmonary effort is normal.     Breath sounds: Normal breath sounds. No wheezing, rhonchi or rales.  Chest:  Breasts:    Right: Absent.     Left: Absent.     Comments: Bilateral mastectomy sites are negative. Abdominal:     General: There is no distension.     Palpations: Abdomen is soft. There is no mass.     Tenderness: There is no abdominal tenderness.     Comments: No obvious hepatosplenomegaly.  It is difficult to assess due to body habitus.  Musculoskeletal:        General: Normal range of motion.     Cervical back: Normal range of motion and neck supple. No tenderness.     Right lower leg: No edema.     Left lower leg: No edema.  Lymphadenopathy:     Cervical: No cervical adenopathy.     Upper Body:     Right upper body: No supraclavicular or axillary adenopathy.     Left upper  body: No supraclavicular or axillary adenopathy.     Lower Body: No right inguinal adenopathy. No left inguinal adenopathy.  Skin:    General: Skin is warm and dry.     Coloration: Skin is not jaundiced.     Findings: No rash.  Neurological:     Mental Status: She is alert and oriented to person, place, and time.     Cranial Nerves: No cranial nerve deficit.  Psychiatric:        Mood and Affect: Mood normal.        Behavior: Behavior normal.        Thought Content: Thought content normal.    LABS:      Latest Ref Rng & Units 11/20/2020    2:52 PM 10/21/2020   12:10 PM  CBC  WBC 4.0 - 10.5 K/uL 6.3  8.0   Hemoglobin 12.0 - 15.0 g/dL 9.7  86.1   Hematocrit 36.0 - 46.0 % 30.4  40.8   Platelets 150 - 400 K/uL 400  315       Latest Ref Rng & Units 11/20/2020    2:52 PM 10/21/2020   12:10 PM  CMP  Glucose 70 - 99 mg/dL 868  898   BUN 8 - 23  mg/dL 9  11   Creatinine 9.55 - 1.00 mg/dL 9.27  9.27   Sodium 864 - 145 mmol/L 140  140   Potassium 3.5 - 5.1 mmol/L 3.1  4.0   Chloride 98 - 111 mmol/L 108  107   CO2 22 - 32 mmol/L 23  23   Calcium 8.9 - 10.3 mg/dL 8.8  9.3   Total Protein 6.5 - 8.1 g/dL 6.0  7.4   Total Bilirubin 0.3 - 1.2 mg/dL 0.4  0.5   Alkaline Phos 38 - 126 U/L 118  137   AST 15 - 41 U/L 21  19   ALT 0 - 44 U/L 20  25      STUDIES:   No results found.    HISTORY:   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   Family history of breast cancer 10/22/2020   Family history of lung cancer 10/22/2020   Family history of malignant neoplasm of breast    Family history of skin cancer 10/22/2020   Hyperlipidemia    Hypertension    Obesity    OSA (obstructive sleep apnea)    uses CPAP nightly   Rosacea    Unspecified otitis media    Vitamin D deficiency     Past Surgical History:  Procedure Laterality Date   ABDOMINAL HYSTERECTOMY     APPENDECTOMY     CESAREAN SECTION     CHOLECYSTECTOMY  2005   SENTINEL NODE BIOPSY Right 11/04/2020   Procedure: RIGHT SENTINEL LYMPH NODE BIOPSY;  Surgeon: Vanderbilt Ned, MD;  Location: [facility];  Service: General;  Laterality: Right;   SIMPLE MASTECTOMY WITH AXILLARY SENTINEL NODE BIOPSY Bilateral 11/04/2020   Procedure: BILATERAL SIMPLE MASTECTOMY;  Surgeon: Vanderbilt Ned, MD;  Location: [facility];  Service: General;  Laterality: Bilateral;   TUBAL LIGATION     VESICOVAGINAL FISTULA CLOSURE W/ TAH  1986    Family History  Problem Relation Age of Onset   Breast cancer Sister 65   Breast cancer Maternal Aunt 55   Breast  cancer Paternal Grandmother 30   Breast cancer Paternal Aunt 57   Breast cancer Sister 9       identical twin sister   Basal cell carcinoma Sister    Lung cancer Paternal Uncle        dx late 55s    Breast cancer Cousin        dx 75s; maternal cousin   Breast cancer Cousin        early 84s; paternal cousin   Breast cancer Cousin 17       paternal cousin   Breast cancer Maternal Aunt 60   Breast cancer Maternal Aunt 22   Melanoma Father    Colon cancer Other 35       maternal aunt's granddaughter    Breast cancer Cousin        dx 32s; maternal cousin   Basal cell carcinoma Nephew        onset before age 58    Social History:  reports that she has never smoked. She has never used smokeless tobacco. She reports that she does not drink alcohol and does not use drugs.The patient is alone today.  Allergies: Allergies[1]  Current Medications: Current Outpatient Medications  Medication Sig Dispense Refill   Ascorbic Acid (VITAMIN C PO) Take 1,000 mg by mouth daily as needed. Only when sick     atorvastatin (LIPITOR) 10 MG tablet Take 10 mg by mouth daily.     Calcium Carb-Cholecalciferol [phone] MG-UNIT CAPS Take by mouth.     cloNIDine  (CATAPRES ) 0.2 MG tablet Take 0.2 mg by mouth 2 (two) times daily.     diclofenac (VOLTAREN) 75 MG EC tablet Take 1 tablet twice a day by oral route with meal(s).     metFORMIN (GLUCOPHAGE) 500 MG tablet Take 500 mg by mouth 2 (two) times daily with a meal.     gabapentin  (NEURONTIN ) 300 MG capsule Take 2 capsules (600 mg total) by mouth at bedtime. 180 capsule 1   No current facility-administered medications for this visit.             [1] No Known Allergies  "

## 2024-08-30 NOTE — Assessment & Plan Note (Addendum)
 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  The patient opted for bilateral mastectomy.  She remains without evidence of disease.  We will plan to see her back in 1 year for repeat examination.

## 2024-10-14 ENCOUNTER — Ambulatory Visit: Admitting: Neurology

## 2025-08-29 ENCOUNTER — Inpatient Hospital Stay: Admitting: Oncology
